# Patient Record
Sex: Female | Born: 1985
Health system: Southern US, Community
[De-identification: ages and names within clinical notes are randomized; demographics above are authoritative.]

## PROBLEM LIST (undated history)

## (undated) ENCOUNTER — Inpatient Hospital Stay (HOSPITAL_COMMUNITY): Payer: Self-pay

## (undated) DIAGNOSIS — D229 Melanocytic nevi, unspecified: Secondary | ICD-10-CM

## (undated) DIAGNOSIS — R51 Headache: Secondary | ICD-10-CM

## (undated) DIAGNOSIS — T85838A Hemorrhage due to other internal prosthetic devices, implants and grafts, initial encounter: Secondary | ICD-10-CM

## (undated) DIAGNOSIS — R5383 Other fatigue: Secondary | ICD-10-CM

## (undated) DIAGNOSIS — D45 Polycythemia vera: Secondary | ICD-10-CM

## (undated) DIAGNOSIS — E282 Polycystic ovarian syndrome: Secondary | ICD-10-CM

## (undated) DIAGNOSIS — C801 Malignant (primary) neoplasm, unspecified: Secondary | ICD-10-CM

## (undated) DIAGNOSIS — T7840XA Allergy, unspecified, initial encounter: Secondary | ICD-10-CM

## (undated) DIAGNOSIS — Z460 Encounter for fitting and adjustment of spectacles and contact lenses: Secondary | ICD-10-CM

## (undated) DIAGNOSIS — J349 Unspecified disorder of nose and nasal sinuses: Secondary | ICD-10-CM

## (undated) DIAGNOSIS — M25529 Pain in unspecified elbow: Secondary | ICD-10-CM

## (undated) DIAGNOSIS — U071 COVID-19: Secondary | ICD-10-CM

## (undated) DIAGNOSIS — G4700 Insomnia, unspecified: Secondary | ICD-10-CM

## (undated) DIAGNOSIS — R112 Nausea with vomiting, unspecified: Secondary | ICD-10-CM

## (undated) DIAGNOSIS — J40 Bronchitis, not specified as acute or chronic: Secondary | ICD-10-CM

## (undated) DIAGNOSIS — Z9889 Other specified postprocedural states: Secondary | ICD-10-CM

## (undated) DIAGNOSIS — R519 Headache, unspecified: Secondary | ICD-10-CM

## (undated) DIAGNOSIS — F988 Other specified behavioral and emotional disorders with onset usually occurring in childhood and adolescence: Secondary | ICD-10-CM

## (undated) DIAGNOSIS — J3489 Other specified disorders of nose and nasal sinuses: Secondary | ICD-10-CM

## (undated) HISTORY — DX: Other fatigue: R53.83

## (undated) HISTORY — DX: Other specified behavioral and emotional disorders with onset usually occurring in childhood and adolescence: F98.8

## (undated) HISTORY — DX: Melanocytic nevi, unspecified: D22.9

## (undated) HISTORY — DX: Malignant (primary) neoplasm, unspecified: C80.1

## (undated) HISTORY — DX: Polycythemia vera: D45

## (undated) HISTORY — DX: Bronchitis, not specified as acute or chronic: J40

## (undated) HISTORY — PX: MUSCLE BIOPSY: SHX716

## (undated) HISTORY — DX: Insomnia, unspecified: G47.00

## (undated) HISTORY — DX: Polycystic ovarian syndrome: E28.2

## (undated) HISTORY — DX: Unspecified disorder of nose and nasal sinuses: J34.9

## (undated) HISTORY — DX: Headache: R51

## (undated) HISTORY — DX: Other specified disorders of nose and nasal sinuses: J34.89

## (undated) HISTORY — DX: Allergy, unspecified, initial encounter: T78.40XA

## (undated) HISTORY — DX: Hemorrhage due to other internal prosthetic devices, implants and grafts, initial encounter: T85.838A

## (undated) HISTORY — DX: Encounter for fitting and adjustment of spectacles and contact lenses: Z46.0

## (undated) HISTORY — PX: OTHER SURGICAL HISTORY: SHX169

## (undated) HISTORY — DX: Pain in unspecified elbow: M25.529

## (undated) HISTORY — DX: Headache, unspecified: R51.9

---
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
## 2005-01-03 ENCOUNTER — Encounter: Admission: RE | Admit: 2005-01-03 | Discharge: 2005-01-03 | Payer: Self-pay | Admitting: Family Medicine

## 2005-09-06 ENCOUNTER — Other Ambulatory Visit: Admission: RE | Admit: 2005-09-06 | Discharge: 2005-09-06 | Payer: Self-pay | Admitting: Family Medicine

## 2007-05-02 ENCOUNTER — Encounter: Admission: RE | Admit: 2007-05-02 | Discharge: 2007-05-02 | Payer: Self-pay | Admitting: Family Medicine

## 2008-07-11 ENCOUNTER — Encounter: Admission: RE | Admit: 2008-07-11 | Discharge: 2008-07-11 | Payer: Self-pay | Admitting: Obstetrics and Gynecology

## 2010-09-15 ENCOUNTER — Encounter: Admission: RE | Admit: 2010-09-15 | Discharge: 2010-09-15 | Payer: Self-pay | Admitting: Family Medicine

## 2011-01-16 ENCOUNTER — Encounter (HOSPITAL_COMMUNITY): Payer: Self-pay | Admitting: Obstetrics and Gynecology

## 2011-03-01 DIAGNOSIS — R519 Headache, unspecified: Secondary | ICD-10-CM

## 2011-03-01 DIAGNOSIS — G4700 Insomnia, unspecified: Secondary | ICD-10-CM

## 2011-03-01 DIAGNOSIS — D229 Melanocytic nevi, unspecified: Secondary | ICD-10-CM

## 2011-03-01 DIAGNOSIS — R5383 Other fatigue: Secondary | ICD-10-CM

## 2011-03-01 DIAGNOSIS — Z460 Encounter for fitting and adjustment of spectacles and contact lenses: Secondary | ICD-10-CM

## 2011-03-01 DIAGNOSIS — J3489 Other specified disorders of nose and nasal sinuses: Secondary | ICD-10-CM

## 2011-03-01 DIAGNOSIS — F988 Other specified behavioral and emotional disorders with onset usually occurring in childhood and adolescence: Secondary | ICD-10-CM

## 2011-03-01 HISTORY — DX: Other specified behavioral and emotional disorders with onset usually occurring in childhood and adolescence: F98.8

## 2011-03-01 HISTORY — DX: Insomnia, unspecified: G47.00

## 2011-03-01 HISTORY — DX: Other fatigue: R53.83

## 2011-03-01 HISTORY — DX: Headache, unspecified: R51.9

## 2011-03-01 HISTORY — DX: Melanocytic nevi, unspecified: D22.9

## 2011-03-01 HISTORY — DX: Encounter for fitting and adjustment of spectacles and contact lenses: Z46.0

## 2011-03-01 HISTORY — DX: Other specified disorders of nose and nasal sinuses: J34.89

## 2011-03-23 ENCOUNTER — Other Ambulatory Visit: Payer: Self-pay | Admitting: General Surgery

## 2011-03-23 DIAGNOSIS — IMO0002 Reserved for concepts with insufficient information to code with codable children: Secondary | ICD-10-CM

## 2011-03-29 ENCOUNTER — Other Ambulatory Visit: Payer: Self-pay

## 2011-04-04 ENCOUNTER — Other Ambulatory Visit: Payer: Self-pay

## 2011-05-16 ENCOUNTER — Other Ambulatory Visit: Payer: Self-pay

## 2011-05-30 ENCOUNTER — Ambulatory Visit
Admission: RE | Admit: 2011-05-30 | Discharge: 2011-05-30 | Disposition: A | Discharge status: Home / Self Care | Payer: BC Managed Care – PPO | Source: Ambulatory Visit | Attending: General Surgery | Admitting: General Surgery

## 2011-05-30 DIAGNOSIS — IMO0002 Reserved for concepts with insufficient information to code with codable children: Secondary | ICD-10-CM

## 2011-05-30 MED ORDER — IOHEXOL 300 MG/ML  SOLN
100.0000 mL | Freq: Once | INTRAMUSCULAR | Status: AC | PRN
  Administered 2011-05-30: 100 mL via INTRAVENOUS

## 2011-06-16 ENCOUNTER — Encounter (INDEPENDENT_AMBULATORY_CARE_PROVIDER_SITE_OTHER): Payer: Self-pay | Admitting: General Surgery

## 2011-07-15 ENCOUNTER — Encounter (INDEPENDENT_AMBULATORY_CARE_PROVIDER_SITE_OTHER): Payer: Self-pay | Admitting: General Surgery

## 2011-07-18 ENCOUNTER — Ambulatory Visit (INDEPENDENT_AMBULATORY_CARE_PROVIDER_SITE_OTHER): Payer: BC Managed Care – PPO | Admitting: General Surgery

## 2011-08-01 ENCOUNTER — Encounter (INDEPENDENT_AMBULATORY_CARE_PROVIDER_SITE_OTHER): Payer: Self-pay | Admitting: General Surgery

## 2011-08-01 ENCOUNTER — Ambulatory Visit (INDEPENDENT_AMBULATORY_CARE_PROVIDER_SITE_OTHER): Payer: BC Managed Care – PPO | Admitting: General Surgery

## 2011-08-01 VITALS — BP 118/66 | HR 90 | Temp 96.7°F | Ht 66.0 in | Wt 167.2 lb

## 2011-08-01 DIAGNOSIS — R1904 Left lower quadrant abdominal swelling, mass and lump: Secondary | ICD-10-CM

## 2011-08-01 NOTE — Progress Notes (Signed)
Wanda Allen is a 25 y.o. female.    Chief Complaint  Patient presents with  . Other    llq pain discuss CT    HPI HPI Pt has a 6 month history of LLQ mass/swelling that she found when exercising.  She was doing very intense cardio workout when she noted a swelling and throbbing pain.  She saw Dr. Freida Busman in march and was advised that this may be a hernia vs a abdominal wall mass.  She underwent a CT scan in June which was normal.  As part of this evaluation, she reduced her activity level in march and the pain improved.  When she saw Dr. Freida Busman in June, she returned to normal activity.  The pain has started to increase again, and she has noted the mass is more prominent.  It usually is around a 4-5 out of 10 pain scale at its worst, but she has had an episode of 8/10 pain.  It wakes her up at night when she rolls over on that side.  She notes that it is worse during her menstrual cycle when she is bloated.  She has some chronic mild nausea that she associates with reflux, but this is unchanged.    Past Medical History  Diagnosis Date  . Allergy   . Asthma   . Headache disorder 03/01/2011  . Skin moles 03/01/2011  . Fatigue 03/01/2011    Loss of sleep/fatigue  . Insomnia, unspecified 03/01/2011  . Sinus drainage 03/01/2011  . Contact lens/glasses fitting 03/01/2011  . ADD (attention deficit disorder) 03/01/2011  . Joint pain, elbow   . Attention deficit disorder (ADD)   . Fatigue   . Atypical moles   . Joint pain, elbow   . Bronchitis   . Abdominal pain   . Sinus problem   . Generalized headaches     May be due to allergies or medication. Patient is not sure.    Past Surgical History  Procedure Date  . Mole     Family History  Problem Relation Age of Onset  . Breast cancer    . Other Mother     abnormal pap smears  . Other Brother     tachycardia    Social History History  Substance Use Topics  . Smoking status: Never Smoker   . Smokeless tobacco: Not on file  . Alcohol  Use: 0.0 oz/week     Social Drinker    Allergies  Allergen Reactions  . Latex Hives and Rash    Were touched.  Marland Kitchen Sulphur (Sulfur Sublimed) Rash    All over body.    Current Outpatient Prescriptions  Medication Sig Dispense Refill  . albuterol (PROVENTIL) (2.5 MG/3ML) 0.083% nebulizer solution Take 2.5 mg by nebulization as needed.        Marland Kitchen lisdexamfetamine (VYVANSE) 60 MG capsule Take 60 mg by mouth every morning.        . norethindrone-ethinyl estradiol (MICROGESTIN,JUNEL,LOESTRIN) 1-20 MG-MCG tablet Take 1 tablet by mouth daily.        . calcium carbonate (TUMS - DOSED IN MG ELEMENTAL CALCIUM) 500 MG chewable tablet Chew 1 tablet by mouth as needed.        . etonogestrel-ethinyl estradiol (NUVARING) 0.12-0.015 MG/24HR vaginal ring Place 1 each vaginally every 28 (twenty-eight) days. Insert vaginally and leave in place for 3 consecutive weeks, then remove for 1 week.       . fluticasone (FLONASE) 50 MCG/ACT nasal spray Place 2 sprays into the nose  as needed.        . pseudoephedrine (SUDAFED) 30 MG tablet Take 30 mg by mouth as needed.          Review of Systems Review of Systems  Constitutional: Negative.   HENT: Positive for congestion.   Eyes: Negative.   Respiratory: Positive for shortness of breath.   Cardiovascular: Negative.   Gastrointestinal: Positive for nausea and abdominal pain.  Genitourinary: Negative.   Musculoskeletal: Negative.   Skin: Negative.   Neurological: Positive for headaches.  Psychiatric/Behavioral: The patient is nervous/anxious.     Physical Exam Physical Exam  Constitutional: She is oriented to person, place, and time. She appears well-developed and well-nourished. No distress.  HENT:  Head: Normocephalic and atraumatic.  Eyes: Conjunctivae are normal. Pupils are equal, round, and reactive to light.  Neck: Normal range of motion. Neck supple. No thyromegaly present.  Cardiovascular: Normal rate, regular rhythm and normal heart sounds.  Exam  reveals no gallop and no friction rub.   No murmur heard. Respiratory: Effort normal and breath sounds normal. No respiratory distress. She has no wheezes. She has no rales. She exhibits no tenderness.  GI: Bowel sounds are normal. She exhibits mass (2 cm area of mass palpable when standing.  ). She exhibits no distension. There is tenderness (LLQ). There is no rebound and no guarding.  Musculoskeletal: Normal range of motion. She exhibits no edema and no tenderness.  Lymphadenopathy:    She has no cervical adenopathy.  Neurological: She is alert and oriented to person, place, and time. Coordination normal.  Skin: Skin is warm and dry. No rash noted. She is not diaphoretic. No erythema. No pallor.  Psychiatric: She has a normal mood and affect. Her behavior is normal. Judgment and thought content normal.     Blood pressure 118/66, pulse 90, temperature 96.7 F (35.9 C), temperature source Temporal, height 5\' 6"  (1.676 m), weight 167 lb 3.2 oz (75.841 kg).  Assessment/Plan  Abdominal mass, left lower quadrant, possible hernia Spigelian hernia vs lipoma Symptomatic with activity. Diagnostic laparoscopy and hernia repair vs resection of abdominal wall mass. Discussed risks, benefits including infection, bleeding, damage to adjacent structures.        Margarethe Virgen 08/01/2011, 10:59 AM

## 2011-08-01 NOTE — Assessment & Plan Note (Addendum)
Spigelian hernia vs lipoma Symptomatic with activity. Diagnostic laparoscopy and hernia repair vs resection of abdominal wall mass. Discussed risks, benefits including infection, bleeding, damage to adjacent structures.

## 2011-08-10 ENCOUNTER — Ambulatory Visit (INDEPENDENT_AMBULATORY_CARE_PROVIDER_SITE_OTHER): Payer: BC Managed Care – PPO | Admitting: General Surgery

## 2011-09-26 ENCOUNTER — Encounter (HOSPITAL_COMMUNITY)
Admission: RE | Admit: 2011-09-26 | Discharge: 2011-09-26 | Disposition: A | Discharge status: Home / Self Care | Payer: BC Managed Care – PPO | Source: Ambulatory Visit | Attending: General Surgery | Admitting: General Surgery

## 2011-09-26 ENCOUNTER — Telehealth (INDEPENDENT_AMBULATORY_CARE_PROVIDER_SITE_OTHER): Payer: Self-pay | Admitting: General Surgery

## 2011-09-26 ENCOUNTER — Other Ambulatory Visit (INDEPENDENT_AMBULATORY_CARE_PROVIDER_SITE_OTHER): Payer: Self-pay | Admitting: General Surgery

## 2011-09-26 DIAGNOSIS — R1900 Intra-abdominal and pelvic swelling, mass and lump, unspecified site: Secondary | ICD-10-CM

## 2011-09-26 LAB — BASIC METABOLIC PANEL
BUN: 13 mg/dL (ref 6–23)
CO2: 28 mEq/L (ref 19–32)
Chloride: 101 mEq/L (ref 96–112)
Glucose, Bld: 91 mg/dL (ref 70–99)
Potassium: 4.8 mEq/L (ref 3.5–5.1)
Sodium: 136 mEq/L (ref 135–145)

## 2011-09-26 LAB — HCG, SERUM, QUALITATIVE: Preg, Serum: NEGATIVE

## 2011-09-26 LAB — SURGICAL PCR SCREEN: Staphylococcus aureus: NEGATIVE

## 2011-09-26 LAB — CBC
MCV: 91.3 fL (ref 78.0–100.0)
Platelets: 247 10*3/uL (ref 150–400)
RBC: 4.93 MIL/uL (ref 3.87–5.11)
WBC: 6.1 10*3/uL (ref 4.0–10.5)

## 2011-09-26 NOTE — Progress Notes (Signed)
Quick Note:  Labs ok for surgery ______ 

## 2011-09-26 NOTE — Telephone Encounter (Signed)
The patient called requesting medication for anxiety to take the night prior to her surgery on 09/30/11 ta

## 2011-09-30 ENCOUNTER — Other Ambulatory Visit (INDEPENDENT_AMBULATORY_CARE_PROVIDER_SITE_OTHER): Payer: Self-pay | Admitting: General Surgery

## 2011-09-30 ENCOUNTER — Ambulatory Visit (HOSPITAL_COMMUNITY)
Admission: RE | Admit: 2011-09-30 | Discharge: 2011-09-30 | Disposition: A | Discharge status: Home / Self Care | Payer: BC Managed Care – PPO | Source: Ambulatory Visit | Attending: General Surgery | Admitting: General Surgery

## 2011-09-30 DIAGNOSIS — Z01810 Encounter for preprocedural cardiovascular examination: Secondary | ICD-10-CM | POA: Insufficient documentation

## 2011-09-30 DIAGNOSIS — R109 Unspecified abdominal pain: Secondary | ICD-10-CM

## 2011-09-30 DIAGNOSIS — D1739 Benign lipomatous neoplasm of skin and subcutaneous tissue of other sites: Secondary | ICD-10-CM

## 2011-09-30 DIAGNOSIS — Z01818 Encounter for other preprocedural examination: Secondary | ICD-10-CM | POA: Insufficient documentation

## 2011-09-30 DIAGNOSIS — Z01812 Encounter for preprocedural laboratory examination: Secondary | ICD-10-CM | POA: Insufficient documentation

## 2011-10-07 ENCOUNTER — Other Ambulatory Visit (INDEPENDENT_AMBULATORY_CARE_PROVIDER_SITE_OTHER): Payer: Self-pay | Admitting: General Surgery

## 2011-10-07 ENCOUNTER — Encounter (INDEPENDENT_AMBULATORY_CARE_PROVIDER_SITE_OTHER): Payer: Self-pay | Admitting: General Surgery

## 2011-10-07 ENCOUNTER — Ambulatory Visit (INDEPENDENT_AMBULATORY_CARE_PROVIDER_SITE_OTHER): Payer: BC Managed Care – PPO | Admitting: General Surgery

## 2011-10-07 ENCOUNTER — Encounter (INDEPENDENT_AMBULATORY_CARE_PROVIDER_SITE_OTHER): Payer: Self-pay

## 2011-10-07 VITALS — BP 122/78 | HR 80 | Temp 99.3°F | Resp 20 | Ht 66.0 in | Wt 169.0 lb

## 2011-10-07 DIAGNOSIS — R1904 Left lower quadrant abdominal swelling, mass and lump: Secondary | ICD-10-CM

## 2011-10-07 MED ORDER — IBUPROFEN 600 MG PO TABS: 600.0000 mg | ORAL_TABLET | Freq: Three times a day (TID) | ORAL | Status: AC | PRN

## 2011-10-07 NOTE — Assessment & Plan Note (Addendum)
No hernia found on dx laparoscopy. Doing well. Drain output decreased. Drain discontinued Ibuprofen/tylenol for pain. Follow up PRN

## 2011-10-07 NOTE — Progress Notes (Signed)
HISTORY: Patient is doing well status post excision of abdominal wall mass and diagnostic laparoscopy because of left lower quadrant pain. This was negative for hernia or other gross pathology. She fevers or chills. She has been keeping track of her drain output has come down to around 20-25 mL per day. She denies significant discomfort. She developed a rash with oxycodone. She has been taking Tylenol for discomfort. She is still a reasonably sore and having difficulty with steps.   PERTINENT REVIEW OF SYSTEMS: Otherwise negative.   EXAM: Head: Normocephalic and atraumatic.  Eyes:  Conjunctivae are normal. Pupils are equal, round, and reactive to light. No scleral icterus.  Neck:  Normal range of motion. Neck supple. No tracheal deviation present. No thyromegaly present.  Resp: No respiratory distress, normal effort. Abd:  Abdomen is soft, non distended and non tender. No masses are palpable.  There is no rebound and no guarding.  Neurological: Alert and oriented to person, place, and time. Coordination normal.  Skin: Skin is warm and dry. No rash noted. No diaphoretic. No erythema. No pallor.  Psychiatric: Normal mood and affect. Normal behavior. Judgment and thought content normal.     Pathology reviewed and demonstrates: lipoma  ASSESSMENT AND PLAN:   Abdominal mass, left lower quadrant, possible hernia No hernia found on dx laparoscopy. Doing well. Drain output decreased. Drain discontinued Ibuprofen/tylenol for pain. Follow up PRN  Back to work in another 1.5 weeks.       Maudry Diego, MD Surgical Oncology, General & Endocrine Surgery [facility]  Astrid Divine, MD, MD Astrid Divine*

## 2011-10-11 NOTE — Op Note (Signed)
NAMEELYSSA, PENDELTON NO.:  [id number]  MEDICAL RECORD NO.:  [id number]  LOCATION:  [facility]                         FACILITY:  [facility]  PHYSICIAN:  Almond Lint, MD       DATE OF BIRTH:  [birth year]  DATE OF PROCEDURE:  09/30/2011 DATE OF DISCHARGE:                              OPERATIVE REPORT   PREOPERATIVE DIAGNOSIS:  Left abdominal wall mass.  POSTOPERATIVE DIAGNOSIS:  Left abdominal wall mass.  PROCEDURE:  Diagnostic laparoscopy, left groin exploration and excision of left abdominal wall mass, subcutaneous 5 x 5 cm.  SURGEON:  Almond Lint, MD  ASSISTANT:  None.  ANESTHESIA:  General and local.  FINDINGS:  Probable lipoma.  SPECIMEN:  Left abdominal wall mass to pathology.  ESTIMATED BLOOD LOSS:  Minimal.  COMPLICATIONS:  None known.  PROCEDURE:  Ms. Ventola was identified in the holding area and taken to the operating room where she was placed supine on the operating room table.  General anesthesia was induced.  A Foley catheter was placed and her arms were tucked.  The patient was then placed into reverse Trendelenburg position and rotated to the right.  A OptiView 5-mm trocar was placed in the left upper quadrant at the costal margin. Pneumoperitoneum was achieved to a pressure of 15 mmHg.  The abdomen was examined.  There was no evidence of gross pathology.  The abdominal wall was carefully examined and there was no evidence of any hernia at this location.  The area of pain and swelling was pressed and there was no evidence of fascial defect.  There was also no evidence of the inguinal hernia.  The other side was examined as well and these were both symmetric.  The entire abdominal wall was examined.  There was no evidence of spigelian hernia or umbilical hernia.  The intra-abdominal contents were examined superficially.  There was no evidence of gross pathology.  The pneumoperitoneum was allowed to release.  The trocar was removed.    The  inguinal-type hernia incision was then made in the left inguinal region and obliquely oriented transverse position.  The Scarpa fascia was opened.  The external oblique was identified and was cleaned off with a Kittner.  The inguinal floor was quite tight.  There was no evidence of an inguinal hernia in indirect or the direct location. There was no evidence of any additional hernia in the abdominal wall. The fatty tissue in the area did appear to be a mass-like area and this was taken out from underneath the skin.  Because of the size of the defect, a drain was placed laterally.  This was a 15 Blake drain.  A drain was sutured in place with a 2-0 nylon.  The hemostasis was achieved with the cautery.  The wound was then reapproximated with 3-0 Vicryl deep dermal sutures and 4-0 Monocryl running subcuticular sutures.  The wound was then irrigated, cleaned, dried and dressed with Dermabond.  The left upper quadrant trocar incision was also closed with a 4-0 Monocryl in subcutaneous fashion.  This was also dressed with Dermabond.  The patient was weaned from anesthesia and taken to PACU in stable condition.  Needle, sponge and instrument counts were  correct.     Almond Lint, MD     FB/MEDQ  D:  09/30/2011  T:  09/30/2011  Job:  [id number]  Electronically Signed by Almond Lint MD on 10/11/2011 07:43:23 PM

## 2012-05-15 ENCOUNTER — Other Ambulatory Visit: Payer: Self-pay | Admitting: Obstetrics and Gynecology

## 2013-09-11 ENCOUNTER — Other Ambulatory Visit: Payer: Self-pay | Admitting: Obstetrics and Gynecology

## 2014-05-05 ENCOUNTER — Other Ambulatory Visit (HOSPITAL_COMMUNITY)
Admission: RE | Admit: 2014-05-05 | Discharge: 2014-05-05 | Disposition: A | Discharge status: Home / Self Care | Payer: BC Managed Care – PPO | Source: Ambulatory Visit | Attending: Family Medicine | Admitting: Family Medicine

## 2014-05-05 ENCOUNTER — Other Ambulatory Visit: Payer: Self-pay | Admitting: Family Medicine

## 2014-05-05 DIAGNOSIS — Z124 Encounter for screening for malignant neoplasm of cervix: Secondary | ICD-10-CM | POA: Insufficient documentation

## 2014-05-05 DIAGNOSIS — R87810 Cervical high risk human papillomavirus (HPV) DNA test positive: Secondary | ICD-10-CM | POA: Insufficient documentation

## 2014-05-05 DIAGNOSIS — Z1151 Encounter for screening for human papillomavirus (HPV): Secondary | ICD-10-CM | POA: Insufficient documentation

## 2014-05-05 DIAGNOSIS — Z113 Encounter for screening for infections with a predominantly sexual mode of transmission: Secondary | ICD-10-CM | POA: Insufficient documentation

## 2014-07-10 ENCOUNTER — Ambulatory Visit: Payer: BC Managed Care – PPO | Attending: Gynecologic Oncology | Admitting: Gynecologic Oncology

## 2014-07-10 ENCOUNTER — Encounter: Payer: Self-pay | Admitting: Gynecologic Oncology

## 2014-07-10 VITALS — BP 138/75 | HR 99 | Temp 98.5°F | Resp 18 | Ht 66.0 in | Wt 156.7 lb

## 2014-07-10 DIAGNOSIS — R87619 Unspecified abnormal cytological findings in specimens from cervix uteri: Secondary | ICD-10-CM | POA: Insufficient documentation

## 2014-07-10 DIAGNOSIS — N879 Dysplasia of cervix uteri, unspecified: Secondary | ICD-10-CM | POA: Insufficient documentation

## 2014-07-10 DIAGNOSIS — F988 Other specified behavioral and emotional disorders with onset usually occurring in childhood and adolescence: Secondary | ICD-10-CM | POA: Diagnosis not present

## 2014-07-10 DIAGNOSIS — G4700 Insomnia, unspecified: Secondary | ICD-10-CM | POA: Insufficient documentation

## 2014-07-10 DIAGNOSIS — J45909 Unspecified asthma, uncomplicated: Secondary | ICD-10-CM | POA: Diagnosis not present

## 2014-07-10 DIAGNOSIS — R897 Abnormal histological findings in specimens from other organs, systems and tissues: Secondary | ICD-10-CM

## 2014-07-10 DIAGNOSIS — R87618 Other abnormal cytological findings on specimens from cervix uteri: Secondary | ICD-10-CM

## 2014-07-10 DIAGNOSIS — Z79899 Other long term (current) drug therapy: Secondary | ICD-10-CM | POA: Diagnosis not present

## 2014-07-10 NOTE — Progress Notes (Signed)
Consult Note: Gyn-Onc  Wanda Allen 28 y.o. female  CC:  Chief Complaint  Patient presents with  . Abnormal Pap smears    HPI: Patient is seen today in consultation as a second opinion. She is referred by Dr. Kelton Pillar at the patient's request.  Patient is a 28 year old gravida 0 who was seen by Dr. Lemar Livings several times her cervical dysplasia. She went colposcopy and directed biopsies in May 2013 for Pap smear revealed atypical squamous cells of undetermined significance. The biopsies were negative. 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 The patient of note has completed the garden cell vaccine series approximately 3 years ago. She states that Dr. Julien Girt had been encouraging her to followup and the patient did not followup for any additional evaluation or procedures. The patient states she was fearful and did not wish to have any other procedures done at that time. She was fearful of the impact of procedures on her fertility.  She recently had a Pap smear performed by Dr. 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  She's currently not sexually active. She recently ended 2 year relationship. Her cycles are fairly regular. Her last cycle was June 16 and she believes she is about to start her cycle now. She stopped her birth control pills secondary to trying a "regulate her hormones" as she has several autoimmune issues occurring. She has had issues with constipation improved with diet. She denies use of tobacco or alcohol. She works as a Haematologist. She exercises about 4 times per week. She has been classes, yoga, and lives weight.   Current Meds:  Outpatient Encounter Prescriptions as of 07/10/2014  Medication Sig  . amphetamine-dextroamphetamine (ADDERALL) 30 MG tablet   .  NON FORMULARY Supplement: Seven Treasures Formula 2 capsules  . albuterol (PROVENTIL) (2.5 MG/3ML) 0.083% nebulizer solution Take 2.5 mg by nebulization as needed.    . calcium carbonate (TUMS - DOSED IN MG ELEMENTAL CALCIUM) 500 MG chewable tablet Chew 1 tablet by mouth as needed.    . fluticasone (FLONASE) 50 MCG/ACT nasal spray Place 2 sprays into the nose as needed.    . pseudoephedrine (SUDAFED) 30 MG tablet Take 30 mg by mouth as needed.    . [DISCONTINUED] etonogestrel-ethinyl estradiol (NUVARING) 0.12-0.015 MG/24HR vaginal ring Place 1 each vaginally every 28 (twenty-eight) days. Insert vaginally and leave in place for 3 consecutive weeks, then remove for 1 week.   . [DISCONTINUED] lisdexamfetamine (VYVANSE) 60 MG capsule Take 60 mg by mouth every morning.    . [DISCONTINUED] norethindrone-ethinyl estradiol (MICROGESTIN,JUNEL,LOESTRIN) 1-20 MG-MCG tablet Take 1 tablet by mouth daily.    . [DISCONTINUED] predniSONE (DELTASONE) 10 MG tablet   . [DISCONTINUED] traMADol (ULTRAM) 50 MG tablet     Allergy:  Allergies  Allergen Reactions  . Effexor Xr  [Venlafaxine Hcl Er]   . Latex Hives and Rash    Were touched.  Melida Gimenez [Sulfur Sublimed] Rash    All over body.    Social Hx:   History   Social History  . Marital Status: Single    Spouse Name: N/A    Number of Children: 0  . Years of Education: N/A   Occupational History  .     Social History Main Topics  . Smoking status: Never Smoker   . Smokeless  tobacco: Not on file  . Alcohol Use: 0.0 oz/week     Comment: Social Drinker  . Drug Use: No  . Sexual Activity: Not on file   Other Topics Concern  . Not on file   Social History Narrative  . No narrative on file    Past Surgical Hx:  Past Surgical History  Procedure Laterality Date  . Mole    . Muscle biopsy    . Mass removal      Past Medical Hx:  Past Medical History  Diagnosis Date  . Allergy   . Asthma   . Headache disorder 03/01/2011  . Skin moles  03/01/2011  . Fatigue 03/01/2011    Loss of sleep/fatigue  . Insomnia, unspecified 03/01/2011  . Sinus drainage 03/01/2011  . Contact lens/glasses fitting 03/01/2011  . ADD (attention deficit disorder) 03/01/2011  . Joint pain, elbow   . Attention deficit disorder (ADD)   . Fatigue   . Atypical moles   . Joint pain, elbow   . Bronchitis   . Abdominal pain   . Sinus problem   . Generalized headaches     May be due to allergies or medication. Patient is not sure.  . JP drain bleeding     Oncology Hx:   No history exists.    Family Hx:  Family History  Problem Relation Age of Onset  . Breast cancer    . Other Mother     abnormal pap smears  . Other Brother     tachycardia    Vitals:  Blood pressure 138/75, pulse 99, temperature 98.5 F (36.9 C), temperature source Oral, resp. rate 18, height 5' 6"  (1.676 m), weight 156 lb 11.2 oz (71.079 kg).  Physical Exam: Well-nourished well-developed female in no acute distress.  Pelvic: Normal external female genitalia. The vagina is well epithelialized no visible lesions. The cervix is visualized is nulliparous. There's a physiologic discharge. There's a menstrual flow. There are no gross visible lesions. Colposcopic evaluation was performed after the application of the gastric. Colposcopy was adequate. There were no acetowhite epithelial changes. After obtaining the patient's verbal consent, and endocervical curettage was performed. The patient tolerated it well. Bimanual examination reveals the uterus to be of normal size, shape, and consistency. There are no adnexal masses.  Assessment/Plan: 28 year old gravida 0 with a history of cervical biopsy on the ectocervix revealing low-grade dysplasia. 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 I met with her and her mother and explained to her that a Pap smear is a screening test in that the biopsies much more diagnostic. We will followup on the results of her  endocervical curettage. If it's unremarkable I am comfortable following expectantly with a repeat pap smear. If the endocervical curettage is positive, she'll require an excisional procedure. The implications regarding pregnancy were discussed with her mother as well as the patient. Her questions were elicited and answered to her satisfaction.  Mckenzey Parcell A., MD 07/10/2014, 1:58 PM

## 2014-07-10 NOTE — Patient Instructions (Signed)

## 2014-07-31 ENCOUNTER — Telehealth: Payer: Self-pay | Admitting: Gynecologic Oncology

## 2014-07-31 ENCOUNTER — Telehealth: Payer: Self-pay | Admitting: *Deleted

## 2014-07-31 NOTE — Telephone Encounter (Signed)
LM for her to call the office to schedule a follow up appt. PG

## 2014-07-31 NOTE — Telephone Encounter (Signed)
PER MD scheduled pt for F/U pap and Colpo. lmovm for pt with Appt 9/16 at 0945am./ Request pt call back to confirm appt date/time

## 2014-09-10 ENCOUNTER — Ambulatory Visit: Payer: BC Managed Care – PPO | Attending: Gynecologic Oncology | Admitting: Gynecologic Oncology

## 2014-09-10 ENCOUNTER — Other Ambulatory Visit (HOSPITAL_COMMUNITY)
Admission: RE | Admit: 2014-09-10 | Discharge: 2014-09-10 | Disposition: A | Discharge status: Home / Self Care | Payer: BC Managed Care – PPO | Source: Ambulatory Visit | Attending: Gynecologic Oncology | Admitting: Gynecologic Oncology

## 2014-09-10 ENCOUNTER — Encounter: Payer: Self-pay | Admitting: Gynecologic Oncology

## 2014-09-10 VITALS — BP 122/68 | HR 77 | Temp 98.0°F | Resp 20 | Ht 66.0 in | Wt 155.5 lb

## 2014-09-10 DIAGNOSIS — L738 Other specified follicular disorders: Secondary | ICD-10-CM | POA: Diagnosis not present

## 2014-09-10 DIAGNOSIS — L678 Other hair color and hair shaft abnormalities: Secondary | ICD-10-CM | POA: Diagnosis not present

## 2014-09-10 DIAGNOSIS — Z124 Encounter for screening for malignant neoplasm of cervix: Secondary | ICD-10-CM | POA: Insufficient documentation

## 2014-09-10 DIAGNOSIS — Z1151 Encounter for screening for human papillomavirus (HPV): Secondary | ICD-10-CM | POA: Insufficient documentation

## 2014-09-10 DIAGNOSIS — R87810 Cervical high risk human papillomavirus (HPV) DNA test positive: Secondary | ICD-10-CM | POA: Insufficient documentation

## 2014-09-10 DIAGNOSIS — R87613 High grade squamous intraepithelial lesion on cytologic smear of cervix (HGSIL): Secondary | ICD-10-CM

## 2014-09-10 DIAGNOSIS — Z8541 Personal history of malignant neoplasm of cervix uteri: Secondary | ICD-10-CM | POA: Diagnosis not present

## 2014-09-10 NOTE — Patient Instructions (Signed)
- 

## 2014-09-10 NOTE — Progress Notes (Signed)
Consult Note: Gyn-Onc  Wanda Allen 28 y.o. female  CC:  Chief Complaint  Patient presents with  . Follow-up    HPI:  Patient is a 28 year old gravida 0 who was seen by Dr. Lemar Livings several times her cervical dysplasia. She went colposcopy and directed biopsies in May 2013 for Pap smear revealed atypical squamous cells of undetermined significance. The biopsies were negative. 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 The patient of note has completed the garden cell vaccine series approximately 3 years ago. She states that Dr. Julien Girt had been encouraging her to followup and the patient did not followup for any additional evaluation or procedures. The patient states she was fearful and did not wish to have any other procedures done at that time. She was fearful of the impact of procedures on her fertility.  She had a Pap smear performed by Dr. 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 I saw her in July. At that time her colposcopy was negative. An endocervical curettage was performed that was negative. She comes in today for followup Pap smear.  She's overall doing fairly well. Her last cycle cannot 2 weeks early and was slightly different in that it lasted longer and was heavier. She did wear a pad as well as tampons. She's also been having issues with increasing left-sided pain on her left side. She has been seen by an orthopedist in the past and they recommended steroids which is not wanted to do. She continues to be physically active riding a spin bike and doing yoga. She did wax recently in the pubic area and has some irritation from that. This in conjunction with needing to wear a pad as well as her tampons this caused her to have a lesion on her right side. She has been off her birth  control pills for past 5-6 months. This is her first cycle was been slightly irregular.   Current Meds:  Outpatient Encounter Prescriptions as of 09/10/2014  Medication Sig  . amphetamine-dextroamphetamine (ADDERALL) 30 MG tablet   . NON FORMULARY Supplement: Seven Treasures Formula 2 capsules  . albuterol (PROVENTIL) (2.5 MG/3ML) 0.083% nebulizer solution Take 2.5 mg by nebulization as needed.    . calcium carbonate (TUMS - DOSED IN MG ELEMENTAL CALCIUM) 500 MG chewable tablet Chew 1 tablet by mouth as needed.    . fluticasone (FLONASE) 50 MCG/ACT nasal spray Place 2 sprays into the nose as needed.    . pseudoephedrine (SUDAFED) 30 MG tablet Take 30 mg by mouth as needed.      Allergy:  Allergies  Allergen Reactions  . Effexor Xr  [Venlafaxine Hcl Er]   . Latex Hives and Rash    Were touched.  . Sulfa Antibiotics Rash  . Sulphur [Sulfur Sublimed] Rash    All over body.    Social Hx:   History   Social History  . Marital Status: Single    Spouse Name: N/A    Number of Children: 0  . Years of Education: N/A   Occupational History  .     Social History Main Topics  . Smoking status: Never Smoker   . Smokeless tobacco: Not on file  . Alcohol Use: 0.0 oz/week     Comment: Social Drinker  . Drug Use: No  . Sexual Activity: Not  on file   Other Topics Concern  . Not on file   Social History Narrative  . No narrative on file    Past Surgical Hx:  Past Surgical History  Procedure Laterality Date  . Mole    . Muscle biopsy    . Mass removal      Past Medical Hx:  Past Medical History  Diagnosis Date  . Allergy   . Asthma   . Headache disorder 03/01/2011  . Skin moles 03/01/2011  . Fatigue 03/01/2011    Loss of sleep/fatigue  . Insomnia, unspecified 03/01/2011  . Sinus drainage 03/01/2011  . Contact lens/glasses fitting 03/01/2011  . ADD (attention deficit disorder) 03/01/2011  . Joint pain, elbow   . Attention deficit disorder (ADD)   . Fatigue   . Atypical moles    . Joint pain, elbow   . Bronchitis   . Abdominal pain   . Sinus problem   . Generalized headaches     May be due to allergies or medication. Patient is not sure.  . JP drain bleeding     Oncology Hx:   No history exists.    Family Hx:  Family History  Problem Relation Age of Onset  . Breast cancer    . Other Mother     abnormal pap smears  . Other Brother     tachycardia    Vitals:  Blood pressure 122/68, pulse 77, temperature 98 F (36.7 C), temperature source Oral, resp. rate 20, height 5\' 6"  (1.676 m), weight 155 lb 8 oz (70.534 kg).  Physical Exam: Well-nourished well-developed female in no acute distress.  Pelvic: Normal external female genitalia notable for some folliculitis. The vagina is well epithelialized no visible lesions. The cervix is visualized is nulliparous. There's a physiologic discharge. There's no visible lesions. Pap smear was submitted without difficulty. Bimanual examination the cervix is palpably normal. The uterus is of normal size shape and consistency. There are no adnexal masses.  Assessment/Plan: 28 year old gravida 0 with a history of cervical biopsy on the ectocervix revealing low-grade dysplasia. 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 Colposcopy with ECC was negative in July of this year. She comes in today for followup Pap smear. I will notify her of the results of her Pap smear. We'll determine her disposition pending these results.  Nitika Jackowski A., MD 09/10/2014, 10:15 AM

## 2014-09-15 LAB — CYTOLOGY - PAP

## 2014-09-19 ENCOUNTER — Telehealth: Payer: Self-pay | Admitting: Gynecologic Oncology

## 2014-09-19 NOTE — Telephone Encounter (Signed)
Left message asking patient to call back the office to discuss Dr. Elenora Gamma recommendations.

## 2014-09-22 ENCOUNTER — Telehealth: Payer: Self-pay | Admitting: Gynecologic Oncology

## 2014-09-22 NOTE — Telephone Encounter (Signed)
Patient notified of pap smear results and Dr. Elenora Gamma recommendations for repeat pap and colpo in 4 months.  She is to call in one to two months to schedule her appt.  Advised to call for any questions or concerns.

## 2014-10-10 ENCOUNTER — Other Ambulatory Visit: Payer: Self-pay

## 2015-01-15 ENCOUNTER — Ambulatory Visit: Payer: BC Managed Care – PPO | Admitting: Gynecologic Oncology

## 2015-01-15 ENCOUNTER — Ambulatory Visit: Payer: BLUE CROSS/BLUE SHIELD | Attending: Gynecologic Oncology | Admitting: Gynecologic Oncology

## 2015-01-15 ENCOUNTER — Encounter: Payer: Self-pay | Admitting: Gynecologic Oncology

## 2015-01-15 ENCOUNTER — Other Ambulatory Visit (HOSPITAL_COMMUNITY)
Admission: RE | Admit: 2015-01-15 | Discharge: 2015-01-15 | Disposition: A | Discharge status: Home / Self Care | Payer: BLUE CROSS/BLUE SHIELD | Source: Ambulatory Visit | Attending: Gynecologic Oncology | Admitting: Gynecologic Oncology

## 2015-01-15 VITALS — BP 133/74 | HR 88 | Temp 98.0°F | Resp 20 | Ht 66.0 in | Wt 159.0 lb

## 2015-01-15 DIAGNOSIS — D069 Carcinoma in situ of cervix, unspecified: Secondary | ICD-10-CM

## 2015-01-15 DIAGNOSIS — R87610 Atypical squamous cells of undetermined significance on cytologic smear of cervix (ASC-US): Secondary | ICD-10-CM | POA: Insufficient documentation

## 2015-01-15 DIAGNOSIS — Z01411 Encounter for gynecological examination (general) (routine) with abnormal findings: Secondary | ICD-10-CM | POA: Diagnosis not present

## 2015-01-15 DIAGNOSIS — Z1151 Encounter for screening for human papillomavirus (HPV): Secondary | ICD-10-CM | POA: Insufficient documentation

## 2015-01-15 DIAGNOSIS — IMO0002 Reserved for concepts with insufficient information to code with codable children: Secondary | ICD-10-CM

## 2015-01-15 NOTE — Progress Notes (Signed)
Consult Note: Gyn-Onc  Wanda Allen 29 y.o. female  CC:  Chief Complaint  Patient presents with  . ASCUS (atypical squamous cells of undeterminded significance    HPI:  Patient is a 29 year old gravida 0 who was seen by Dr. Lemar Livings several times her cervical dysplasia. She went colposcopy and directed biopsies in May 2013 for Pap smear revealed atypical squamous cells of undetermined significance. The biopsies were negative. 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 The patient of note has completed the garden cell vaccine series approximately 3 years ago. She states that Dr. Julien Girt had been encouraging her to followup and the patient did not followup for any additional evaluation or procedures. The patient states she was fearful and did not wish to have any other procedures done at that time. She was fearful of the impact of procedures on her fertility.  She had a Pap smear performed by Dr. 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 I saw her in July. At that time her colposcopy was negative. An endocervical curettage was performed that was negative. She comes in today for followup Pap smear.  I last saw her in Sept.at that time pap smear revealed ASCUS HR-HPV.  She's overall doing fairly well. She had some irregular bleeding in Oct and Nov. And none since. She is in a new relationship now. She is not using any contraception other than condoms. She was seen at the [facility] endocrinology clinic secondary to alopecia. She has a had a workup from TSH etc. that was unremarkable.   Current Meds:  Outpatient Encounter Prescriptions as of 01/15/2015  Medication Sig  . amphetamine-dextroamphetamine (ADDERALL) 30 MG tablet Take 30 mg by mouth daily.   . calcium carbonate (TUMS - DOSED IN MG  ELEMENTAL CALCIUM) 500 MG chewable tablet Chew 1 tablet by mouth as needed.    . NON FORMULARY Supplement: Seven Treasures Formula 2 capsules  . pseudoephedrine (SUDAFED) 30 MG tablet Take 30 mg by mouth as needed.    Marland Kitchen albuterol (PROVENTIL) (2.5 MG/3ML) 0.083% nebulizer solution Take 2.5 mg by nebulization as needed.    . fluticasone (FLONASE) 50 MCG/ACT nasal spray Place 2 sprays into the nose as needed.      Allergy:  Allergies  Allergen Reactions  . Effexor Xr  [Venlafaxine Hcl Er]   . Latex Hives and Rash    Were touched.  . Sulfa Antibiotics Rash  . Sulphur [Sulfur Sublimed] Rash    All over body.    Social Hx:   History   Social History  . Marital Status: Single    Spouse Name: N/A    Number of Children: 0  . Years of Education: N/A   Occupational History  .     Social History Main Topics  . Smoking status: Never Smoker   . Smokeless tobacco: Not on file  . Alcohol Use: 0.0 oz/week     Comment: Social Drinker  . Drug Use: No  . Sexual Activity: Not on file   Other Topics Concern  . Not on file   Social History Narrative    Past Surgical Hx:  Past Surgical History  Procedure Laterality Date  . Mole    . Muscle biopsy    . Mass removal      Past Medical Hx:  Past Medical  History  Diagnosis Date  . Allergy   . Asthma   . Headache disorder 03/01/2011  . Skin moles 03/01/2011  . Fatigue 03/01/2011    Loss of sleep/fatigue  . Insomnia, unspecified 03/01/2011  . Sinus drainage 03/01/2011  . Contact lens/glasses fitting 03/01/2011  . ADD (attention deficit disorder) 03/01/2011  . Joint pain, elbow   . Attention deficit disorder (ADD)   . Fatigue   . Atypical moles   . Joint pain, elbow   . Bronchitis   . Abdominal pain   . Sinus problem   . Generalized headaches     May be due to allergies or medication. Patient is not sure.  . JP drain bleeding     Oncology Hx:   No history exists.    Family Hx:  Family History  Problem Relation Age of Onset  .  Breast cancer    . Other Mother     abnormal pap smears  . Other Brother     tachycardia    Vitals:  Blood pressure 133/74, pulse 88, temperature 98 F (36.7 C), temperature source Oral, resp. rate 20, height 5\' 6"  (1.676 m), weight 159 lb (72.122 kg).  Physical Exam: Well-nourished well-developed female in no acute distress.  Pelvic: Normal external female genitalia notable for some folliculitis. The vagina is well epithelialized no visible lesions. The cervix is visualized is nulliparous. There's a physiologic discharge. There's no visible lesions. Pap smear was submitted without difficulty.   Colposcopic examination was performed after the application acetic acid. There was an irregular area noted at 6:00. After verbal consent was obtained a cervical biopsy at 6:00 was performed. An endocervical curettage was similarly performed. Hemostasis was obtained using silver nitrate. The patient tolerated this fairly well. She did have a little bit of chills and sweats. She did not have a vagal episode.   Assessment/Plan: 29 year old gravida 0 with a history of cervical biopsy on the ectocervix revealing low-grade dysplasia. 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 Colposcopy with ECC was negative in July of this year. She comes in today for followup Pap smear and colposcopy. I will follow-up in results of the Pap smear, cervical biopsy, and endocervical curettage with her next week. She does know that we will call her with the results and determine disposition pending them.  Nancy Marus A., MD 01/15/2015, 12:48 PM

## 2015-01-15 NOTE — Patient Instructions (Signed)
We will contact you with the results of your pap smear, ECC, and biopsy.  Please retrain from intercourse for 24 hours.  Please call for any questions or concerns.

## 2015-01-20 LAB — CYTOLOGY - PAP

## 2015-01-23 ENCOUNTER — Telehealth: Payer: Self-pay | Admitting: Gynecologic Oncology

## 2015-01-23 NOTE — Telephone Encounter (Signed)
Patient calling about results.  EPIC inbox message sent to her by Dr. Alycia Rossetti but patient stating she has been having difficulty seeing information on mychart.  Results discussed along with Dr. Elenora Gamma recommendations for follow up appt in six months with a repeat pap and colpo at that time.  Advised to call for any questions or concerns.

## 2015-07-01 ENCOUNTER — Ambulatory Visit: Payer: BLUE CROSS/BLUE SHIELD | Attending: Gynecologic Oncology | Admitting: Gynecologic Oncology

## 2015-07-01 ENCOUNTER — Other Ambulatory Visit (HOSPITAL_COMMUNITY)
Admission: RE | Admit: 2015-07-01 | Discharge: 2015-07-01 | Disposition: A | Discharge status: Home / Self Care | Payer: BLUE CROSS/BLUE SHIELD | Source: Ambulatory Visit | Attending: Gynecologic Oncology | Admitting: Gynecologic Oncology

## 2015-07-01 ENCOUNTER — Other Ambulatory Visit: Payer: BLUE CROSS/BLUE SHIELD

## 2015-07-01 ENCOUNTER — Encounter: Payer: Self-pay | Admitting: Gynecologic Oncology

## 2015-07-01 VITALS — BP 125/83 | HR 78 | Temp 98.1°F | Resp 20 | Ht 66.0 in | Wt 159.3 lb

## 2015-07-01 DIAGNOSIS — Z1151 Encounter for screening for human papillomavirus (HPV): Secondary | ICD-10-CM | POA: Diagnosis present

## 2015-07-01 DIAGNOSIS — Z01411 Encounter for gynecological examination (general) (routine) with abnormal findings: Secondary | ICD-10-CM | POA: Diagnosis not present

## 2015-07-01 DIAGNOSIS — IMO0002 Reserved for concepts with insufficient information to code with codable children: Secondary | ICD-10-CM

## 2015-07-01 DIAGNOSIS — R896 Abnormal cytological findings in specimens from other organs, systems and tissues: Secondary | ICD-10-CM | POA: Diagnosis not present

## 2015-07-01 MED ORDER — CLINDAMYCIN PHOSPHATE 1 % EX LOTN: TOPICAL_LOTION | Freq: Two times a day (BID) | CUTANEOUS | Status: DC

## 2015-07-01 NOTE — Patient Instructions (Signed)
We will call you with the results of your pap smear and labs. Followup with depend on the results of the pap smear from today. If you have any questions or concerns, please call our office.

## 2015-07-01 NOTE — Progress Notes (Signed)
Consult Note: Gyn-Onc  Wanda Allen 29 y.o. female  CC:  Chief Complaint  Patient presents with  . ASCUS with positive high risk HPV    followup    HPI:  Patient is a 30 year old gravida 0 who was seen by Dr. Lemar Livings several times her cervical dysplasia. She went colposcopy and directed biopsies in May 2013 for Pap smear revealed atypical squamous cells of undetermined significance. The biopsies were negative. 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 The patient of note has completed the garden cell vaccine series approximately 3 years ago. She states that Dr. Julien Girt had been encouraging her to followup and the patient did not followup for any additional evaluation or procedures. The patient states she was fearful and did not wish to have any other procedures done at that time. She was fearful of the impact of procedures on her fertility.  She had a Pap smear performed by Dr. 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 I saw her in July. At that time her colposcopy was negative. An endocervical curettage was performed that was negative. She comes in today for followup Pap smear.  I saw her in Sept.,at that time pap smear revealed ASCUS HR-HPV. She followed up with me in January 2016. At that time she underwent Pap smear and colposcopy. Her Pap smear was negative however colposcopy revealed low-grade dysplasia on a cervical biopsy at 6:00. Endocervical curettage revealed koilocytic atypia consistent with HPV but no dysplasia. She comes in today for follow-up.  She is currently in a one-year long relationship and they're no longer using contraception but are using condoms. She is very tearful today. She states that she is very hormonal her cycles of been irregular. She'll some spotting for  several days and then have bleeding and then spotting again. She had some cramping over the weekend. She does not wish to proceed with oral contraception pills or the To control these symptoms as she would very much like to conceive this and she gets married. She also complains that she sweating more. She's used multiple different types of deodorants and feels that she's voiding more than she normally does. She's been seen by endocrine in the past and they recommend a follow-up in one year. She's having hot flashes at night but states that she is in a cold sweat. She's having increasing acne as well as hidradenitis suppurativa symptoms. This is very frustrating to her.   Current Meds:  Outpatient Encounter Prescriptions as of 07/01/2015  Medication Sig  . amphetamine-dextroamphetamine (ADDERALL) 30 MG tablet Take 30 mg by mouth daily.   . NON FORMULARY PURE magnesium supplement - 2 tablets at bedtime daily  . NON FORMULARY Formula 1 herbal supplement for dietary tract - 2 tablets by mouth daily  . albuterol (PROVENTIL) (2.5 MG/3ML) 0.083% nebulizer solution Take 2.5 mg by nebulization as needed.    . calcium carbonate (TUMS - DOSED IN MG ELEMENTAL CALCIUM) 500 MG chewable tablet Chew 1 tablet by mouth as needed.    . [DISCONTINUED] fluticasone (FLONASE) 50 MCG/ACT nasal spray Place 2 sprays into the nose as needed.    . [DISCONTINUED] NON FORMULARY Supplement: Seven Treasures Formula 2 capsules  . [DISCONTINUED] pseudoephedrine (SUDAFED) 30 MG tablet Take 30 mg by mouth as needed.     No facility-administered encounter  medications on file as of 07/01/2015.    Allergy:  Allergies  Allergen Reactions  . Effexor Xr  [Venlafaxine Hcl Er]   . Latex Hives and Rash    Were touched.  . Sulfa Antibiotics Rash  . Sulphur [Sulfur Sublimed] Rash    All over body.    Social Hx:   History   Social History  . Marital Status: Single    Spouse Name: N/A  . Number of Children: 0  . Years of Education:  N/A   Occupational History  .     Social History Main Topics  . Smoking status: Never Smoker   . Smokeless tobacco: Not on file  . Alcohol Use: 0.0 oz/week     Comment: Social Drinker  . Drug Use: No  . Sexual Activity: Not on file   Other Topics Concern  . Not on file   Social History Narrative    Past Surgical Hx:  Past Surgical History  Procedure Laterality Date  . Mole    . Muscle biopsy    . Mass removal      Past Medical Hx:  Past Medical History  Diagnosis Date  . Allergy   . Asthma   . Headache disorder 03/01/2011  . Skin moles 03/01/2011  . Fatigue 03/01/2011    Loss of sleep/fatigue  . Insomnia, unspecified 03/01/2011  . Sinus drainage 03/01/2011  . Contact lens/glasses fitting 03/01/2011  . ADD (attention deficit disorder) 03/01/2011  . Joint pain, elbow   . Attention deficit disorder (ADD)   . Fatigue   . Atypical moles   . Joint pain, elbow   . Bronchitis   . Abdominal pain   . Sinus problem   . Generalized headaches     May be due to allergies or medication. Patient is not sure.  . JP drain bleeding     Oncology Hx:   No history exists.    Family Hx:  Family History  Problem Relation Age of Onset  . Breast cancer    . Other Mother     abnormal pap smears  . Other Brother     tachycardia    Vitals:  Blood pressure 125/83, pulse 78, temperature 98.1 F (36.7 C), temperature source Oral, resp. rate 20, height 5\' 6"  (1.676 m), weight 159 lb 4.8 oz (72.258 kg), SpO2 100 %.  Physical Exam: Well-nourished well-developed female in no acute distress.  Pelvic: Normal external female genitalia notable for some folliculitis. The vagina is well epithelialized no visible lesions. The cervix is visualized is nulliparous. There's a physiologic discharge. There's no visible lesions. Pap smear was submitted without difficulty.   Colposcopic examination was performed after the application acetic acid. There are no visible lesions. Bimanual examination the  cervix is palpably normal. The uterus is of normal size shape and consistency. There are no adnexal masses.  Assessment/Plan: 29 year old gravida 0 with a history of cervical biopsy on the ectocervix revealing low-grade dysplasia. 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 Colposcopy with ECC was negative in July of this year. Most recently when I saw her Pap smear was normal 6 months ago. ECC was negative with exception of koilocytic atypia and her cervical biopsy revealed low-grade dysplasia. She comes in today for followup Pap smear and colposcopy. I will follow-up in results of the Pap smear as well as FSH, LH, estradiol and TSH to evaluate for the other symptoms she's having. She doesn't we will call her with the  results and determine her chest disposition pending them. Alezandra Egli A., MD 07/01/2015, 10:40 AM

## 2015-07-02 LAB — FOLLICLE STIMULATING HORMONE: FSH: 8.4 m[IU]/mL

## 2015-07-02 LAB — ESTRADIOL: Estradiol: 56.3 pg/mL

## 2015-07-02 LAB — LUTEINIZING HORMONE: LH: 6.7 m[IU]/mL

## 2015-07-03 ENCOUNTER — Telehealth: Payer: Self-pay | Admitting: *Deleted

## 2015-07-03 LAB — CYTOLOGY - PAP

## 2015-07-03 NOTE — Telephone Encounter (Signed)
Notified pt labs normal, call office with any concerns.

## 2015-07-06 NOTE — Telephone Encounter (Signed)
error 

## 2015-08-06 ENCOUNTER — Telehealth: Payer: Self-pay | Admitting: Nurse Practitioner

## 2015-08-06 NOTE — Telephone Encounter (Signed)
Late entry: 08/05/15 RN called at [phone] and [phone] to inform patient per Joylene John, NP f/u with regular gyn in 1 year with PAP; no answer and did not leave VM. RN to continue to try to reach patient.

## 2015-08-07 NOTE — Telephone Encounter (Signed)
Patient returned call and states she will followup with her PCP for GYN care. No other concerns noted at this time - patient instructed to call our office with any questions or concerns in the future.

## 2015-08-07 NOTE — Telephone Encounter (Addendum)
Attempted to reach patient in regards to information noted below. Left VM requesting return call.

## 2016-05-30 ENCOUNTER — Other Ambulatory Visit: Payer: Self-pay | Admitting: Family Medicine

## 2016-05-30 ENCOUNTER — Other Ambulatory Visit (HOSPITAL_COMMUNITY)
Admission: RE | Admit: 2016-05-30 | Discharge: 2016-05-30 | Disposition: A | Discharge status: Home / Self Care | Payer: BLUE CROSS/BLUE SHIELD | Source: Ambulatory Visit | Attending: Family Medicine | Admitting: Family Medicine

## 2016-05-30 DIAGNOSIS — R87619 Unspecified abnormal cytological findings in specimens from cervix uteri: Secondary | ICD-10-CM | POA: Insufficient documentation

## 2016-06-02 LAB — CYTOLOGY - PAP

## 2017-12-17 LAB — OB RESULTS CONSOLE ABO/RH: RH Type: POSITIVE

## 2017-12-17 LAB — OB RESULTS CONSOLE ANTIBODY SCREEN: Antibody Screen: NEGATIVE

## 2017-12-17 LAB — OB RESULTS CONSOLE HIV ANTIBODY (ROUTINE TESTING): HIV: NONREACTIVE

## 2017-12-17 LAB — OB RESULTS CONSOLE HEPATITIS B SURFACE ANTIGEN: Hepatitis B Surface Ag: NEGATIVE

## 2017-12-17 LAB — OB RESULTS CONSOLE GC/CHLAMYDIA
Chlamydia: NEGATIVE
Gonorrhea: NEGATIVE

## 2017-12-17 LAB — OB RESULTS CONSOLE RPR: RPR: NONREACTIVE

## 2017-12-17 LAB — OB RESULTS CONSOLE RUBELLA ANTIBODY, IGM: RUBELLA: IMMUNE

## 2018-07-11 ENCOUNTER — Inpatient Hospital Stay (HOSPITAL_COMMUNITY)
Admission: AD | Admit: 2018-07-11 | Discharge: 2018-07-11 | Disposition: A | Discharge status: Home / Self Care | Payer: 59 | Source: Ambulatory Visit | Attending: Obstetrics & Gynecology | Admitting: Obstetrics & Gynecology

## 2018-07-11 ENCOUNTER — Encounter (HOSPITAL_COMMUNITY): Payer: Self-pay | Admitting: *Deleted

## 2018-07-11 DIAGNOSIS — O479 False labor, unspecified: Secondary | ICD-10-CM | POA: Insufficient documentation

## 2018-07-11 LAB — AMNISURE RUPTURE OF MEMBRANE (ROM) NOT AT ARMC: AMNISURE: NEGATIVE

## 2018-07-11 NOTE — MAU Note (Signed)
Pt had sharp back pain & LLQ pain during the night, got up to get in the tub, had gush of clear fluid @ 0130.  Continues to leak this morning, has been wearing a pad.  Denies bleeding, has some cramping, increased pelvic pressure.  Decreased fetal movement.

## 2018-07-11 NOTE — MAU Note (Signed)
Urine sent to lab 

## 2018-07-11 NOTE — Discharge Instructions (Signed)
Braxton Hicks Contractions °Contractions of the uterus can occur throughout pregnancy, but they are not always a sign that you are in labor. You may have practice contractions called Braxton Hicks contractions. These false labor contractions are sometimes confused with true labor. °What are Braxton Hicks contractions? °Braxton Hicks contractions are tightening movements that occur in the muscles of the uterus before labor. Unlike true labor contractions, these contractions do not result in opening (dilation) and thinning of the cervix. Toward the end of pregnancy (32-34 weeks), Braxton Hicks contractions can happen more often and may become stronger. These contractions are sometimes difficult to tell apart from true labor because they can be very uncomfortable. You should not feel embarrassed if you go to the hospital with false labor. °Sometimes, the only way to tell if you are in true labor is for your health care provider to look for changes in the cervix. The health care provider will do a physical exam and may monitor your contractions. If you are not in true labor, the exam should show that your cervix is not dilating and your water has not broken. °If there are other health problems associated with your pregnancy, it is completely safe for you to be sent home with false labor. You may continue to have Braxton Hicks contractions until you go into true labor. °How to tell the difference between true labor and false labor °True labor °· Contractions last 30-70 seconds. °· Contractions become very regular. °· Discomfort is usually felt in the top of the uterus, and it spreads to the lower abdomen and low back. °· Contractions do not go away with walking. °· Contractions usually become more intense and increase in frequency. °· The cervix dilates and gets thinner. °False labor °· Contractions are usually shorter and not as strong as true labor contractions. °· Contractions are usually irregular. °· Contractions  are often felt in the front of the lower abdomen and in the groin. °· Contractions may go away when you walk around or change positions while lying down. °· Contractions get weaker and are shorter-lasting as time goes on. °· The cervix usually does not dilate or become thin. °Follow these instructions at home: °· Take over-the-counter and prescription medicines only as told by your health care provider. °· Keep up with your usual exercises and follow other instructions from your health care provider. °· Eat and drink lightly if you think you are going into labor. °· If Braxton Hicks contractions are making you uncomfortable: °? Change your position from lying down or resting to walking, or change from walking to resting. °? Sit and rest in a tub of warm water. °? Drink enough fluid to keep your urine pale yellow. Dehydration may cause these contractions. °? Do slow and deep breathing several times an hour. °· Keep all follow-up prenatal visits as told by your health care provider. This is important. °Contact a health care provider if: °· You have a fever. °· You have continuous pain in your abdomen. °Get help right away if: °· Your contractions become stronger, more regular, and closer together. °· You have fluid leaking or gushing from your vagina. °· You pass blood-tinged mucus (bloody show). °· You have bleeding from your vagina. °· You have low back pain that you never had before. °· You feel your baby’s head pushing down and causing pelvic pressure. °· Your baby is not moving inside you as much as it used to. °Summary °· Contractions that occur before labor are called Braxton   Hicks contractions, false labor, or practice contractions. °· Braxton Hicks contractions are usually shorter, weaker, farther apart, and less regular than true labor contractions. True labor contractions usually become progressively stronger and regular and they become more frequent. °· Manage discomfort from Braxton Hicks contractions by  changing position, resting in a warm bath, drinking plenty of water, or practicing deep breathing. °This information is not intended to replace advice given to you by your health care provider. Make sure you discuss any questions you have with your health care provider. °Document Released: 04/27/2017 Document Revised: 04/27/2017 Document Reviewed: 04/27/2017 °Elsevier Interactive Patient Education © 2018 [employer] ° °

## 2018-07-11 NOTE — MAU Note (Signed)
Fern done on Pt, no ferning shown on microscope.

## 2018-07-23 ENCOUNTER — Telehealth (HOSPITAL_COMMUNITY): Payer: Self-pay | Admitting: *Deleted

## 2018-07-23 ENCOUNTER — Encounter (HOSPITAL_COMMUNITY): Payer: Self-pay | Admitting: *Deleted

## 2018-07-23 MED ORDER — TERBUTALINE SULFATE 1 MG/ML IJ SOLN
0.2500 mg | Freq: Once | INTRAMUSCULAR | Status: AC
  Administered 2018-07-24: 0.25 mg via SUBCUTANEOUS
  Filled 2018-07-23 (×2): qty 1

## 2018-07-23 NOTE — Telephone Encounter (Signed)
Preadmission screen  

## 2018-07-23 NOTE — H&P (Signed)
HPI: 32 y/o G1P0 @ [redacted]w[redacted]d estimated gestational age (as dated by LMP c/w 20 week ultrasound) presents for scheduled external cephalic verison due to breech presentation.   no Leaking of Fluid,   no Vaginal Bleeding,   no Uterine Contractions,  + Fetal Movement.  Prenatal care has been provided by Dr. Nelda Marseille  ROS: no HA, no epigastric pain, no visual changes.    Pregnancy complicated by: -Obesity, BMI 39  Prenatal Transfer Tool  Maternal Diabetes: No Genetic Screening: Normal Maternal Ultrasounds/Referrals: Normal Fetal Ultrasounds or other Referrals:  None Maternal Substance Abuse:  No Significant Maternal Medications:  None Significant Maternal Lab Results: Lab values include: Group B Strep negative   PNL:  GBS neg, Rub Immune, Hep B neg, RPR NR, HIV neg, GC/C neg, glucola:79 Hgb: 13.1 Blood type: B pos, antibody neg  Immunizations: Tdap: 6/14 Flu: declined  OBHx: primip PMHx:  PCOS Meds:  PNV Allergy:   Allergies  Allergen Reactions  . Effexor Xr  [Venlafaxine Hcl Er]   . Latex Hives and Rash    Were touched.  . Sulfa Antibiotics Rash  . Sulphur [Sulfur Sublimed] Rash    All over body.   SurgHx: none SocHx:   no Tobacco, no  EtOH, no Illicit Drugs  O: to be obtained Gen. AAOx3, NAD CV.  RRR  No murmur.  Resp. CTAB, no wheeze or crackles. Abd. Gravid,  no tenderness,  no rigidity,  no guarding Extr.  no edema B/L , no calf tenderness, neg Homan's B/L FHT: 145 baseline, mod variability, + accels,  no decels BSUS:  Breech   Labs: see orders  A/P:  32 y.o. G1P0 @ [redacted]w[redacted]d EGA who presents for ECV due to breech presentation -FWB:  NICHD Cat I FHTs -NPO -LR @ 125cc/hr -Terbutaline to bedside -CBC, T&S to be obtained -Risk/benefit reviewed with patient- inform consent obtained  Janyth Pupa, DO [phone] (cell) [phone] (office)

## 2018-07-24 ENCOUNTER — Encounter (HOSPITAL_COMMUNITY): Payer: Self-pay

## 2018-07-24 ENCOUNTER — Observation Stay (HOSPITAL_COMMUNITY)
Admission: RE | Admit: 2018-07-24 | Discharge: 2018-07-24 | Disposition: A | Discharge status: Home / Self Care | Payer: 59 | Source: Ambulatory Visit | Attending: Obstetrics & Gynecology | Admitting: Obstetrics & Gynecology

## 2018-07-24 DIAGNOSIS — O99283 Endocrine, nutritional and metabolic diseases complicating pregnancy, third trimester: Secondary | ICD-10-CM | POA: Insufficient documentation

## 2018-07-24 DIAGNOSIS — Z3A36 36 weeks gestation of pregnancy: Secondary | ICD-10-CM | POA: Diagnosis not present

## 2018-07-24 DIAGNOSIS — O321XX0 Maternal care for breech presentation, not applicable or unspecified: Secondary | ICD-10-CM | POA: Diagnosis not present

## 2018-07-24 DIAGNOSIS — Z882 Allergy status to sulfonamides status: Secondary | ICD-10-CM | POA: Diagnosis not present

## 2018-07-24 DIAGNOSIS — E282 Polycystic ovarian syndrome: Secondary | ICD-10-CM | POA: Insufficient documentation

## 2018-07-24 LAB — TYPE AND SCREEN
ABO/RH(D): B POS
Antibody Screen: NEGATIVE

## 2018-07-24 LAB — CBC
HCT: 36.8 % (ref 36.0–46.0)
Hemoglobin: 12.5 g/dL (ref 12.0–15.0)
MCH: 30.2 pg (ref 26.0–34.0)
MCHC: 34 g/dL (ref 30.0–36.0)
MCV: 88.9 fL (ref 78.0–100.0)
Platelets: 234 10*3/uL (ref 150–400)
RBC: 4.14 MIL/uL (ref 3.87–5.11)
RDW: 13.4 % (ref 11.5–15.5)
WBC: 10.7 10*3/uL — AB (ref 4.0–10.5)

## 2018-07-24 LAB — ABO/RH: ABO/RH(D): B POS

## 2018-07-24 MED ORDER — LACTATED RINGERS IV SOLN: 500.0000 mL | INTRAVENOUS | Status: DC | PRN

## 2018-07-24 MED ORDER — LIDOCAINE HCL (PF) 1 % IJ SOLN
30.0000 mL | INTRAMUSCULAR | Status: DC | PRN
  Filled 2018-07-24: qty 30

## 2018-07-24 MED ORDER — SOD CITRATE-CITRIC ACID 500-334 MG/5ML PO SOLN
ORAL | Status: AC
  Filled 2018-07-24: qty 15

## 2018-07-24 MED ORDER — LACTATED RINGERS IV SOLN
INTRAVENOUS | Status: DC
  Administered 2018-07-24: 08:00:00 via INTRAVENOUS

## 2018-07-24 NOTE — Discharge Instructions (Signed)
Braxton Hicks Contractions °Contractions of the uterus can occur throughout pregnancy, but they are not always a sign that you are in labor. You may have practice contractions called Braxton Hicks contractions. These false labor contractions are sometimes confused with true labor. °What are Braxton Hicks contractions? °Braxton Hicks contractions are tightening movements that occur in the muscles of the uterus before labor. Unlike true labor contractions, these contractions do not result in opening (dilation) and thinning of the cervix. Toward the end of pregnancy (32-34 weeks), Braxton Hicks contractions can happen more often and may become stronger. These contractions are sometimes difficult to tell apart from true labor because they can be very uncomfortable. You should not feel embarrassed if you go to the hospital with false labor. °Sometimes, the only way to tell if you are in true labor is for your health care provider to look for changes in the cervix. The health care provider will do a physical exam and may monitor your contractions. If you are not in true labor, the exam should show that your cervix is not dilating and your water has not broken. °If there are other health problems associated with your pregnancy, it is completely safe for you to be sent home with false labor. You may continue to have Braxton Hicks contractions until you go into true labor. °How to tell the difference between true labor and false labor °True labor °· Contractions last 30-70 seconds. °· Contractions become very regular. °· Discomfort is usually felt in the top of the uterus, and it spreads to the lower abdomen and low back. °· Contractions do not go away with walking. °· Contractions usually become more intense and increase in frequency. °· The cervix dilates and gets thinner. °False labor °· Contractions are usually shorter and not as strong as true labor contractions. °· Contractions are usually irregular. °· Contractions  are often felt in the front of the lower abdomen and in the groin. °· Contractions may go away when you walk around or change positions while lying down. °· Contractions get weaker and are shorter-lasting as time goes on. °· The cervix usually does not dilate or become thin. °Follow these instructions at home: °· Take over-the-counter and prescription medicines only as told by your health care provider. °· Keep up with your usual exercises and follow other instructions from your health care provider. °· Eat and drink lightly if you think you are going into labor. °· If Braxton Hicks contractions are making you uncomfortable: °? Change your position from lying down or resting to walking, or change from walking to resting. °? Sit and rest in a tub of warm water. °? Drink enough fluid to keep your urine pale yellow. Dehydration may cause these contractions. °? Do slow and deep breathing several times an hour. °· Keep all follow-up prenatal visits as told by your health care provider. This is important. °Contact a health care provider if: °· You have a fever. °· You have continuous pain in your abdomen. °Get help right away if: °· Your contractions become stronger, more regular, and closer together. °· You have fluid leaking or gushing from your vagina. °· You pass blood-tinged mucus (bloody show). °· You have bleeding from your vagina. °· You have low back pain that you never had before. °· You feel your baby’s head pushing down and causing pelvic pressure. °· Your baby is not moving inside you as much as it used to. °Summary °· Contractions that occur before labor are called Braxton   Hicks contractions, false labor, or practice contractions. °· Braxton Hicks contractions are usually shorter, weaker, farther apart, and less regular than true labor contractions. True labor contractions usually become progressively stronger and regular and they become more frequent. °· Manage discomfort from Braxton Hicks contractions by  changing position, resting in a warm bath, drinking plenty of water, or practicing deep breathing. °This information is not intended to replace advice given to you by your health care provider. Make sure you discuss any questions you have with your health care provider. °Document Released: 04/27/2017 Document Revised: 04/27/2017 Document Reviewed: 04/27/2017 °Elsevier Interactive Patient Education © 2018 [employer] ° °

## 2018-07-24 NOTE — Procedures (Signed)
External Cephalic Version Procedure Note  Patient Name: Wanda Allen Date of admission: 07/24/18 Date of procedure: 07/24/18  Surgeon: Dr. Janyth Pupa Assistant: Dr. Cyndia Skeeters  Procedure performed: External cephalic version   Procedure description:  After informed consent was obtained a time out was held and the procedure was verified. FHT were monitored prior to the start of the procedure and were found to be NICHD cat I. A BSUS was performed and verified that the infant was frank breech presentation with sacrum posterior and fetal head maternal right. Terbutaline 0.25mg  IV was given prior to the start of the procedure. The breech was attempted to be disengaged from the maternal pelvis and the fetus was rotated in a  backward somersault fashion. Despite two separate attempts including trying to disengage the breech presentation vaginally, the procedure was not a success.  [place] confirmed that there was no movement of the infant's sacrum. After completion of the procedure, fetal heart tones were confirmed to be reassuring.    Janyth Pupa, DO [phone] (cell) [phone] (office)

## 2018-07-25 ENCOUNTER — Telehealth (HOSPITAL_COMMUNITY): Payer: Self-pay | Admitting: *Deleted

## 2018-07-25 ENCOUNTER — Encounter (HOSPITAL_COMMUNITY): Payer: Self-pay

## 2018-07-25 NOTE — Telephone Encounter (Signed)
Preadmission screen  

## 2018-08-01 NOTE — Discharge Summary (Signed)
Physician Discharge Summary  Patient ID: Wanda Allen MRN: [id number] DOB/AGE: February 07, 1986 32 y.o.  Admit date: 07/24/2018 Discharge date: 07/24/2018  Admission Diagnoses: Breech presentation  Discharge Diagnoses: same Active Problems:   * No active hospital problems. *   Discharged Condition: stable  Hospital Course: [name]@[redacted]w[redacted]d  who presents for external cephalic version.  Procedure was performed- see note and pt was discharged home in stable condition.  Consults: None  Significant Diagnostic Studies: angiography: n/a   Treatments: IV hydration  Discharge Exam: Blood pressure 125/70, pulse 90, temperature 98.4 F (36.9 C), temperature source Oral, resp. rate 18, height 5\' 6"  (1.676 m), weight 108.1 kg (238 lb 6.4 oz). Gen. AAOx3, NAD CV.  RRR  No murmur.  Resp. CTAB, no wheeze or crackles. Abd. Gravid,  no tenderness,  no rigidity,  no guarding Extr.  no edema B/L , no calf tenderness, neg Homan's B/L FHT: 145 baseline, mod variability, + accels,  no decels BSUS:  Breech              Gen. AAOx3, NAD CV.  RRR  No murmur.  Resp. CTAB, no wheeze or crackles. Abd. Gravid,  no tenderness,  no rigidity,  no guarding Extr.  no edema B/L , no calf tenderness, neg Homan's B/L FHT: 145 baseline, mod variability, + accels,  no decels BSUS:  Breech                Disposition:    Allergies as of 07/24/2018      Reactions   Effexor Xr [venlafaxine Hcl Er] Other (See Comments)   Unknown, possibly rash    Latex Hives, Rash   Were touched.   Sulfa Antibiotics Rash   Sulphur [sulfur Sublimed] Rash   All over body.      Medication List    TAKE these medications   acetaminophen 500 MG tablet Commonly known as:  TYLENOL Take 1,000 mg by mouth every 6 (six) hours as needed for mild pain.   calcium carbonate 500 MG chewable tablet Commonly known as:  TUMS - dosed in mg elemental calcium Chew 1 tablet by mouth as needed for indigestion.   phenylephrine-shark  liver oil-mineral oil-petrolatum 0.25-3-14-71.9 % rectal ointment Commonly known as:  PREPARATION H Place 1 application rectally 2 (two) times daily as needed for hemorrhoids.   prenatal multivitamin Tabs tablet Take 2 tablets by mouth daily at 12 noon.      Follow-up Information    Wanda Pupa, DO Follow up in 1 week(s).   Specialty:  Obstetrics and Gynecology Contact information: [address] [place] [address] [phone]           Signed: [name] 08/01/2018, 2:53 PM

## 2018-08-07 ENCOUNTER — Encounter (HOSPITAL_COMMUNITY)
Admission: RE | Admit: 2018-08-07 | Discharge: 2018-08-07 | Disposition: A | Discharge status: Home / Self Care | Payer: 59 | Source: Ambulatory Visit | Attending: Obstetrics & Gynecology | Admitting: Obstetrics & Gynecology

## 2018-08-07 HISTORY — DX: Nausea with vomiting, unspecified: R11.2

## 2018-08-07 HISTORY — DX: Other specified postprocedural states: Z98.890

## 2018-08-07 LAB — CBC
HCT: 36.4 % (ref 36.0–46.0)
Hemoglobin: 12.3 g/dL (ref 12.0–15.0)
MCH: 30.2 pg (ref 26.0–34.0)
MCHC: 33.8 g/dL (ref 30.0–36.0)
MCV: 89.4 fL (ref 78.0–100.0)
PLATELETS: 245 10*3/uL (ref 150–400)
RBC: 4.07 MIL/uL (ref 3.87–5.11)
RDW: 13.7 % (ref 11.5–15.5)
WBC: 9.5 10*3/uL (ref 4.0–10.5)

## 2018-08-07 LAB — TYPE AND SCREEN
ABO/RH(D): B POS
Antibody Screen: NEGATIVE

## 2018-08-07 NOTE — Patient Instructions (Signed)
Wanda Allen  08/07/2018   Your procedure is scheduled on:  08/08/2018  Enter through the Main Entrance of [facility] at 1:30 PM.  Pick up the phone at the desk and dial [phone]  Call this number if you have problems the morning of [phone]  Remember:   Do not eat food:(After Midnight) Desps de medianoche.  Do not drink clear liquids: (6 Hours before arrival) 6 horas ante llegada.  Take these medicines the morning of surgery with A SIP OF WATER: none   Do not wear jewelry, make-up or nail polish.  Do not wear lotions, powders, or perfumes. Do not wear deodorant.  Do not shave 48 hours prior to surgery.  Do not bring valuables to the hospital.  [facility] is not   responsible for any belongings or valuables brought to the hospital.  Contacts, dentures or bridgework may not be worn into surgery.  Leave suitcase in the car. After surgery it may be brought to your room.  For patients admitted to the hospital, checkout time is 11:00 AM the day of              discharge.    N/A   Please read over the following fact sheets that you were given:   Surgical Site Infection Prevention

## 2018-08-07 NOTE — H&P (Signed)
HPI: 32 y/o G1P0 @ [redacted]w[redacted]d estimated gestational age (as dated by LMP c/w 20 week ultrasound) presents for scheduled primary C-section   no Leaking of Fluid,   no Vaginal Bleeding,   no Uterine Contractions,  + Fetal Movement.  Prenatal care has been provided by Dr. Nelda Marseille  ROS: no HA, no epigastric pain, no visual changes.    Pregnancy complicated by: -Obesity, BMI 39 -Breech presentation- failed ECV  Prenatal Transfer Tool  Maternal Diabetes: No Genetic Screening: Normal Maternal Ultrasounds/Referrals: Normal Fetal Ultrasounds or other Referrals:  None Maternal Substance Abuse:  No Significant Maternal Medications:  None Significant Maternal Lab Results: Lab values include: Group B Strep negative   PNL:  GBS neg, Rub Immune, Hep B neg, RPR NR, HIV neg, GC/C neg, glucola:79 Hgb: 13.1 Blood type: B pos, antibody neg  Immunizations: Tdap: 6/14 Flu: declined  OBHx: primip PMHx:  PCOS Meds:  PNV Allergy:   Allergies  Allergen Reactions  . Effexor Xr [Venlafaxine Hcl Er] Other (See Comments)    Unknown, possibly rash   . Latex Hives and Rash    Were touched.  . Sulfa Antibiotics Rash  . Sulphur [Sulfur Sublimed] Rash    All over body.   SurgHx: none SocHx:   no Tobacco, no  EtOH, no Illicit Drugs  O: vitals to be obtained Gen. AAOx3, NAD CV.  RRR  No murmur.  Resp. CTAB, no wheeze or crackles. Abd. Gravid,  no tenderness,  no rigidity,  no guarding Extr.  no edema B/L , no calf tenderness, neg Homan's B/L FHT: 145 baseline, mod variability, + accels,  no decels BSUS:  Breech   Labs: see orders  A/P:  32 y.o. G1P0 @ [redacted]w[redacted]d who presents for primary C-section due to breech presentation -FWB:  NICHD Cat I FHTs -NPO -LR @ 125cc/hr -Ancef IV to OR -CBC, T&S to be obtained -Risk/benefit reviewed with patient- inform consent obtained  Janyth Pupa, DO [phone] (cell) [phone] (office)

## 2018-08-08 ENCOUNTER — Inpatient Hospital Stay (HOSPITAL_COMMUNITY)
Admission: AD | Admit: 2018-08-08 | Discharge: 2018-08-11 | DRG: 788 | Disposition: A | Discharge status: Home / Self Care | Payer: 59 | Attending: Obstetrics & Gynecology | Admitting: Obstetrics & Gynecology

## 2018-08-08 ENCOUNTER — Inpatient Hospital Stay (HOSPITAL_COMMUNITY): Payer: 59 | Admitting: Anesthesiology

## 2018-08-08 ENCOUNTER — Other Ambulatory Visit: Payer: Self-pay

## 2018-08-08 ENCOUNTER — Encounter (HOSPITAL_COMMUNITY)
Admission: RE | Disposition: A | Discharge status: Home / Self Care | Payer: Self-pay | Source: Ambulatory Visit | Attending: Obstetrics & Gynecology

## 2018-08-08 ENCOUNTER — Encounter (HOSPITAL_COMMUNITY)
Admission: AD | Disposition: A | Discharge status: Home / Self Care | Payer: Self-pay | Source: Home / Self Care | Attending: Obstetrics & Gynecology

## 2018-08-08 ENCOUNTER — Encounter (HOSPITAL_COMMUNITY): Payer: Self-pay | Admitting: *Deleted

## 2018-08-08 DIAGNOSIS — O99214 Obesity complicating childbirth: Secondary | ICD-10-CM | POA: Diagnosis present

## 2018-08-08 DIAGNOSIS — Z3A39 39 weeks gestation of pregnancy: Secondary | ICD-10-CM

## 2018-08-08 DIAGNOSIS — E669 Obesity, unspecified: Secondary | ICD-10-CM | POA: Diagnosis present

## 2018-08-08 DIAGNOSIS — Z91040 Latex allergy status: Secondary | ICD-10-CM

## 2018-08-08 DIAGNOSIS — O321XX0 Maternal care for breech presentation, not applicable or unspecified: Secondary | ICD-10-CM | POA: Diagnosis present

## 2018-08-08 DIAGNOSIS — Z3490 Encounter for supervision of normal pregnancy, unspecified, unspecified trimester: Secondary | ICD-10-CM

## 2018-08-08 LAB — RPR: RPR Ser Ql: NONREACTIVE

## 2018-08-08 SURGERY — Surgical Case
Anesthesia: Choice

## 2018-08-08 SURGERY — Surgical Case
Anesthesia: Spinal

## 2018-08-08 MED ORDER — LACTATED RINGERS IV SOLN
INTRAVENOUS | Status: DC
  Administered 2018-08-08 (×2): via INTRAVENOUS

## 2018-08-08 MED ORDER — ONDANSETRON HCL 4 MG/2ML IJ SOLN: 4.0000 mg | Freq: Three times a day (TID) | INTRAMUSCULAR | Status: DC | PRN

## 2018-08-08 MED ORDER — NALBUPHINE HCL 10 MG/ML IJ SOLN: 5.0000 mg | INTRAMUSCULAR | Status: DC | PRN

## 2018-08-08 MED ORDER — IBUPROFEN 600 MG PO TABS
600.0000 mg | ORAL_TABLET | Freq: Four times a day (QID) | ORAL | Status: DC
  Administered 2018-08-08 – 2018-08-11 (×11): 600 mg via ORAL
  Filled 2018-08-08 (×11): qty 1

## 2018-08-08 MED ORDER — OXYCODONE HCL 5 MG PO TABS
10.0000 mg | ORAL_TABLET | ORAL | Status: DC | PRN
  Administered 2018-08-10 – 2018-08-11 (×2): 10 mg via ORAL
  Filled 2018-08-08 (×2): qty 2

## 2018-08-08 MED ORDER — SODIUM CHLORIDE 0.9% FLUSH: 3.0000 mL | INTRAVENOUS | Status: DC | PRN

## 2018-08-08 MED ORDER — MEPERIDINE HCL 25 MG/ML IJ SOLN: 6.2500 mg | INTRAMUSCULAR | Status: DC | PRN

## 2018-08-08 MED ORDER — CEFAZOLIN SODIUM-DEXTROSE 2-4 GM/100ML-% IV SOLN
2.0000 g | INTRAVENOUS | Status: AC
  Administered 2018-08-08: 2 g via INTRAVENOUS

## 2018-08-08 MED ORDER — OXYTOCIN 10 UNIT/ML IJ SOLN
INTRAMUSCULAR | Status: AC
Start: 2018-08-08 — End: ?
  Filled 2018-08-08: qty 4

## 2018-08-08 MED ORDER — FENTANYL CITRATE (PF) 100 MCG/2ML IJ SOLN: 25.0000 ug | INTRAMUSCULAR | Status: DC | PRN

## 2018-08-08 MED ORDER — DIPHENHYDRAMINE HCL 25 MG PO CAPS: 25.0000 mg | ORAL_CAPSULE | ORAL | Status: DC | PRN

## 2018-08-08 MED ORDER — ACETAMINOPHEN 325 MG PO TABS
650.0000 mg | ORAL_TABLET | ORAL | Status: DC | PRN
  Administered 2018-08-08 – 2018-08-11 (×11): 650 mg via ORAL
  Filled 2018-08-08 (×11): qty 2

## 2018-08-08 MED ORDER — SODIUM CHLORIDE 0.9 % IR SOLN
Status: DC | PRN
  Administered 2018-08-08: 1

## 2018-08-08 MED ORDER — FENTANYL CITRATE (PF) 100 MCG/2ML IJ SOLN
INTRAMUSCULAR | Status: AC
  Filled 2018-08-08: qty 2

## 2018-08-08 MED ORDER — SENNOSIDES-DOCUSATE SODIUM 8.6-50 MG PO TABS
2.0000 | ORAL_TABLET | ORAL | Status: DC
  Administered 2018-08-08 – 2018-08-11 (×3): 2 via ORAL
  Filled 2018-08-08 (×3): qty 2

## 2018-08-08 MED ORDER — OXYTOCIN 40 UNITS IN LACTATED RINGERS INFUSION - SIMPLE MED: 2.5000 [IU]/h | INTRAVENOUS | Status: AC

## 2018-08-08 MED ORDER — DIPHENHYDRAMINE HCL 25 MG PO CAPS: 25.0000 mg | ORAL_CAPSULE | Freq: Four times a day (QID) | ORAL | Status: DC | PRN

## 2018-08-08 MED ORDER — ACETAMINOPHEN 325 MG PO TABS: 325.0000 mg | ORAL_TABLET | ORAL | Status: DC | PRN

## 2018-08-08 MED ORDER — COCONUT OIL OIL
1.0000 "application " | TOPICAL_OIL | Status: DC | PRN
  Administered 2018-08-10: 1 via TOPICAL
  Filled 2018-08-08: qty 120

## 2018-08-08 MED ORDER — OXYCODONE HCL 5 MG PO TABS
5.0000 mg | ORAL_TABLET | ORAL | Status: DC | PRN
  Administered 2018-08-09 – 2018-08-11 (×10): 5 mg via ORAL
  Filled 2018-08-08 (×10): qty 1

## 2018-08-08 MED ORDER — DIBUCAINE 1 % RE OINT
1.0000 "application " | TOPICAL_OINTMENT | RECTAL | Status: DC | PRN
  Administered 2018-08-10: 1 via RECTAL
  Filled 2018-08-08: qty 28

## 2018-08-08 MED ORDER — OXYCODONE HCL 5 MG/5ML PO SOLN: 5.0000 mg | Freq: Once | ORAL | Status: DC | PRN

## 2018-08-08 MED ORDER — PRENATAL MULTIVITAMIN CH
1.0000 | ORAL_TABLET | Freq: Every day | ORAL | Status: DC
  Filled 2018-08-08: qty 1

## 2018-08-08 MED ORDER — KETOROLAC TROMETHAMINE 30 MG/ML IJ SOLN
30.0000 mg | Freq: Four times a day (QID) | INTRAMUSCULAR | Status: AC | PRN
  Administered 2018-08-08: 30 mg via INTRAMUSCULAR

## 2018-08-08 MED ORDER — PHENYLEPHRINE 8 MG IN D5W 100 ML (0.08MG/ML) PREMIX OPTIME
INJECTION | INTRAVENOUS | Status: AC
  Filled 2018-08-08: qty 100

## 2018-08-08 MED ORDER — NALBUPHINE HCL 10 MG/ML IJ SOLN: 5.0000 mg | Freq: Once | INTRAMUSCULAR | Status: DC | PRN

## 2018-08-08 MED ORDER — SIMETHICONE 80 MG PO CHEW: 80.0000 mg | CHEWABLE_TABLET | ORAL | Status: DC | PRN

## 2018-08-08 MED ORDER — OXYCODONE HCL 5 MG PO TABS
5.0000 mg | ORAL_TABLET | Freq: Once | ORAL | Status: DC | PRN
Start: 2018-08-08 — End: 2018-08-08

## 2018-08-08 MED ORDER — PHENYLEPHRINE 8 MG IN D5W 100 ML (0.08MG/ML) PREMIX OPTIME
INJECTION | INTRAVENOUS | Status: DC | PRN
  Administered 2018-08-08: 60 ug/min via INTRAVENOUS

## 2018-08-08 MED ORDER — MORPHINE SULFATE (PF) 0.5 MG/ML IJ SOLN
INTRAMUSCULAR | Status: DC | PRN
  Administered 2018-08-08: .15 mg via INTRATHECAL

## 2018-08-08 MED ORDER — BUPIVACAINE IN DEXTROSE 0.75-8.25 % IT SOLN
INTRATHECAL | Status: DC | PRN
  Administered 2018-08-08: 1.8 mL via INTRATHECAL

## 2018-08-08 MED ORDER — MORPHINE SULFATE (PF) 0.5 MG/ML IJ SOLN
INTRAMUSCULAR | Status: AC
  Filled 2018-08-08: qty 10

## 2018-08-08 MED ORDER — ZOLPIDEM TARTRATE 5 MG PO TABS: 5.0000 mg | ORAL_TABLET | Freq: Every evening | ORAL | Status: DC | PRN

## 2018-08-08 MED ORDER — DIPHENHYDRAMINE HCL 50 MG/ML IJ SOLN: 12.5000 mg | INTRAMUSCULAR | Status: DC | PRN

## 2018-08-08 MED ORDER — FENTANYL CITRATE (PF) 100 MCG/2ML IJ SOLN
INTRAMUSCULAR | Status: DC | PRN
  Administered 2018-08-08: 15 ug via INTRATHECAL

## 2018-08-08 MED ORDER — KETOROLAC TROMETHAMINE 30 MG/ML IJ SOLN: 30.0000 mg | Freq: Four times a day (QID) | INTRAMUSCULAR | Status: AC | PRN

## 2018-08-08 MED ORDER — MENTHOL 3 MG MT LOZG: 1.0000 | LOZENGE | OROMUCOSAL | Status: DC | PRN

## 2018-08-08 MED ORDER — SIMETHICONE 80 MG PO CHEW
80.0000 mg | CHEWABLE_TABLET | ORAL | Status: DC
  Administered 2018-08-08 – 2018-08-11 (×3): 80 mg via ORAL
  Filled 2018-08-08 (×3): qty 1

## 2018-08-08 MED ORDER — SIMETHICONE 80 MG PO CHEW
80.0000 mg | CHEWABLE_TABLET | Freq: Three times a day (TID) | ORAL | Status: DC
  Administered 2018-08-09 – 2018-08-11 (×6): 80 mg via ORAL
  Filled 2018-08-08 (×6): qty 1

## 2018-08-08 MED ORDER — LACTATED RINGERS IV SOLN
INTRAVENOUS | Status: DC | PRN
  Administered 2018-08-08: 16:00:00 via INTRAVENOUS

## 2018-08-08 MED ORDER — KETOROLAC TROMETHAMINE 30 MG/ML IJ SOLN
INTRAMUSCULAR | Status: AC
  Filled 2018-08-08: qty 1

## 2018-08-08 MED ORDER — OXYTOCIN 10 UNIT/ML IJ SOLN
INTRAVENOUS | Status: DC | PRN
  Administered 2018-08-08: 40 [IU] via INTRAVENOUS

## 2018-08-08 MED ORDER — ONDANSETRON HCL 4 MG/2ML IJ SOLN
INTRAMUSCULAR | Status: AC
  Filled 2018-08-08: qty 2

## 2018-08-08 MED ORDER — SCOPOLAMINE 1 MG/3DAYS TD PT72: 1.0000 | MEDICATED_PATCH | Freq: Once | TRANSDERMAL | Status: DC

## 2018-08-08 MED ORDER — LACTATED RINGERS IV SOLN
INTRAVENOUS | Status: DC
  Administered 2018-08-09: 02:00:00 via INTRAVENOUS

## 2018-08-08 MED ORDER — ONDANSETRON HCL 4 MG/2ML IJ SOLN: 4.0000 mg | Freq: Once | INTRAMUSCULAR | Status: DC | PRN

## 2018-08-08 MED ORDER — DEXAMETHASONE SODIUM PHOSPHATE 10 MG/ML IJ SOLN
INTRAMUSCULAR | Status: DC | PRN
  Administered 2018-08-08: 10 mg via INTRAVENOUS

## 2018-08-08 MED ORDER — NALOXONE HCL 4 MG/10ML IJ SOLN
1.0000 ug/kg/h | INTRAVENOUS | Status: DC | PRN
  Filled 2018-08-08: qty 5

## 2018-08-08 MED ORDER — ACETAMINOPHEN 160 MG/5ML PO SOLN: 325.0000 mg | ORAL | Status: DC | PRN

## 2018-08-08 MED ORDER — DEXAMETHASONE SODIUM PHOSPHATE 10 MG/ML IJ SOLN
INTRAMUSCULAR | Status: AC
  Filled 2018-08-08: qty 1

## 2018-08-08 MED ORDER — NALOXONE HCL 0.4 MG/ML IJ SOLN: 0.4000 mg | INTRAMUSCULAR | Status: DC | PRN

## 2018-08-08 MED ORDER — ONDANSETRON HCL 4 MG/2ML IJ SOLN
INTRAMUSCULAR | Status: DC | PRN
  Administered 2018-08-08: 4 mg via INTRAVENOUS

## 2018-08-08 MED ORDER — WITCH HAZEL-GLYCERIN EX PADS: 1.0000 "application " | MEDICATED_PAD | CUTANEOUS | Status: DC | PRN

## 2018-08-08 SURGICAL SUPPLY — 39 items
ADH SKN CLS APL DERMABOND .7 (GAUZE/BANDAGES/DRESSINGS)
APL SKNCLS STERI-STRIP NONHPOA (GAUZE/BANDAGES/DRESSINGS) ×1
BARRIER ADHS 3X4 INTERCEED (GAUZE/BANDAGES/DRESSINGS) ×1 IMPLANT
BENZOIN TINCTURE PRP APPL 2/3 (GAUZE/BANDAGES/DRESSINGS) ×2 IMPLANT
BRR ADH 4X3 ABS CNTRL BYND (GAUZE/BANDAGES/DRESSINGS) ×1
CHLORAPREP W/TINT 26ML (MISCELLANEOUS) ×2 IMPLANT
CLAMP CORD UMBIL (MISCELLANEOUS) IMPLANT
CLOTH BEACON ORANGE TIMEOUT ST (SAFETY) ×2 IMPLANT
DERMABOND ADVANCED (GAUZE/BANDAGES/DRESSINGS)
DERMABOND ADVANCED .7 DNX12 (GAUZE/BANDAGES/DRESSINGS) IMPLANT
DRSG OPSITE POSTOP 4X10 (GAUZE/BANDAGES/DRESSINGS) ×2 IMPLANT
ELECT REM PT RETURN 9FT ADLT (ELECTROSURGICAL) ×2
ELECTRODE REM PT RTRN 9FT ADLT (ELECTROSURGICAL) ×1 IMPLANT
EXTRACTOR VACUUM KIWI (MISCELLANEOUS) IMPLANT
GLOVE BIOGEL PI IND STRL 7.0 (GLOVE) ×3 IMPLANT
GLOVE BIOGEL PI INDICATOR 7.0 (GLOVE) ×3
GLOVE ECLIPSE 6.5 STRL STRAW (GLOVE) ×2 IMPLANT
GOWN STRL REUS W/TWL LRG LVL3 (GOWN DISPOSABLE) ×6 IMPLANT
KIT ABG SYR 3ML LUER SLIP (SYRINGE) IMPLANT
NDL HYPO 25X5/8 SAFETYGLIDE (NEEDLE) IMPLANT
NEEDLE HYPO 25X5/8 SAFETYGLIDE (NEEDLE) IMPLANT
NS IRRIG 1000ML POUR BTL (IV SOLUTION) ×2 IMPLANT
PACK C SECTION WH (CUSTOM PROCEDURE TRAY) ×2 IMPLANT
PAD ABD 7.5X8 STRL (GAUZE/BANDAGES/DRESSINGS) ×2 IMPLANT
PAD OB MATERNITY 4.3X12.25 (PERSONAL CARE ITEMS) ×2 IMPLANT
PENCIL SMOKE EVAC W/HOLSTER (ELECTROSURGICAL) ×2 IMPLANT
RTRCTR C-SECT PINK 25CM LRG (MISCELLANEOUS) ×2 IMPLANT
STRIP CLOSURE SKIN 1/2X4 (GAUZE/BANDAGES/DRESSINGS) ×2 IMPLANT
SUT PLAIN 0 NONE (SUTURE) IMPLANT
SUT PLAIN 2 0 XLH (SUTURE) IMPLANT
SUT VIC AB 0 CT1 27 (SUTURE) ×4
SUT VIC AB 0 CT1 27XBRD ANBCTR (SUTURE) ×2 IMPLANT
SUT VIC AB 0 CTX 36 (SUTURE) ×6
SUT VIC AB 0 CTX36XBRD ANBCTRL (SUTURE) ×3 IMPLANT
SUT VIC AB 2-0 CT1 27 (SUTURE) ×2
SUT VIC AB 2-0 CT1 TAPERPNT 27 (SUTURE) ×1 IMPLANT
SUT VIC AB 4-0 KS 27 (SUTURE) ×2 IMPLANT
TOWEL OR 17X24 6PK STRL BLUE (TOWEL DISPOSABLE) ×2 IMPLANT
TRAY FOLEY W/BAG SLVR 14FR LF (SET/KITS/TRAYS/PACK) ×1 IMPLANT

## 2018-08-08 NOTE — Anesthesia Preprocedure Evaluation (Signed)
Anesthesia Evaluation  Patient identified by MRN, date of birth, ID band Patient awake    Reviewed: Allergy & Precautions, H&P , NPO status , Patient's Chart, lab work & pertinent test results, reviewed documented beta blocker date and time   History of Anesthesia Complications (+) PONV  Airway Mallampati: II  TM Distance: >3 FB Neck ROM: full    Dental no notable dental hx.    Pulmonary neg pulmonary ROS,    Pulmonary exam normal breath sounds clear to auscultation       Cardiovascular negative cardio ROS Normal cardiovascular exam Rhythm:regular Rate:Normal     Neuro/Psych negative neurological ROS  negative psych ROS   GI/Hepatic negative GI ROS, Neg liver ROS,   Endo/Other  negative endocrine ROS  Renal/GU negative Renal ROS  negative genitourinary   Musculoskeletal   Abdominal   Peds  Hematology negative hematology ROS (+)   Anesthesia Other Findings   Reproductive/Obstetrics (+) Pregnancy                             Anesthesia Physical Anesthesia Plan  ASA: II  Anesthesia Plan: Spinal   Post-op Pain Management:    Induction:   PONV Risk Score and Plan: 2 and Ondansetron, Dexamethasone and Treatment may vary due to age or medical condition  Airway Management Planned:   Additional Equipment:   Intra-op Plan:   Post-operative Plan:   Informed Consent: I have reviewed the patients History and Physical, chart, labs and discussed the procedure including the risks, benefits and alternatives for the proposed anesthesia with the patient or authorized representative who has indicated his/her understanding and acceptance.     Plan Discussed with: CRNA and Anesthesiologist  Anesthesia Plan Comments:         Anesthesia Quick Evaluation

## 2018-08-08 NOTE — Lactation Note (Signed)
This note was copied from a baby's chart. Lactation Consultation Note  Patient Name: Wanda Allen POIPP'G Date: 08/08/2018 Reason for consult: 1st time breastfeeding;Term;Initial assessment P1, 6 hours female infant, C/S delivery Per mom, attended child birth classes with breastfeeding component. Per mom, has Medela DEBP. Per dad, infant had 4 soiled (meconium) diapers and 1 wet diapers. LC help mom latch infant using the football hold, infant's mouth was wide,  top lip flanged and lower jaw down, audible swallowing heard. Latch-8, infant BF -15 minutes. LC discussed I & O. Mom encouraged to feed baby 8-12 times/24 hours and with feeding cues.  Reviewed Baby & Me book's Breastfeeding Basics.  Mom made aware of O/P services, breastfeeding support groups, community resources, and our phone # for post-discharge questions.      Maternal Data Formula Feeding for Exclusion: No Has patient been taught Hand Expression?: Yes Does the patient have breastfeeding experience prior to this delivery?: No  Feeding Feeding Type: Breast Fed Length of feed: 15 min  LATCH Score Latch: Grasps breast easily, tongue down, lips flanged, rhythmical sucking.  Audible Swallowing: A few with stimulation  Type of Nipple: Everted at rest and after stimulation  Comfort (Breast/Nipple): Soft / non-tender  Hold (Positioning): Assistance needed to correctly position infant at breast and maintain latch.  LATCH Score: 8  Interventions Interventions: Support pillows;Adjust position;Skin to skin;Breast massage;Hand express;Assisted with latch;Breast feeding basics reviewed;Breast compression;Position options  Lactation Tools Discussed/Used WIC Program: No   Consult Status Consult Status: Follow-up Date: 08/09/18 Follow-up type: In-patient    Wanda Allen 08/08/2018, 11:13 PM

## 2018-08-08 NOTE — Interval H&P Note (Signed)
History and Physical Interval Note:  08/08/2018 3:29 PM  Wanda Allen  has presented today for surgery, with the diagnosis of * No pre-op diagnosis entered *  The various methods of treatment have been discussed with the patient and family. After consideration of risks, benefits and other options for treatment, the patient has consented to  Procedure(s): CESAREAN SECTION (N/A) as a surgical intervention .  The patient's history has been reviewed, patient examined, no change in status, stable for surgery.  I have reviewed the patient's chart and labs.  Questions were answered to the patient's satisfaction.     [name]

## 2018-08-08 NOTE — Anesthesia Procedure Notes (Signed)
Spinal  Patient location during procedure: OR Start time: 08/08/2018 3:56 PM End time: 08/08/2018 4:00 PM Staffing Anesthesiologist: Janeece Riggers, MD Preanesthetic Checklist Completed: patient identified, site marked, surgical consent, pre-op evaluation, timeout performed, IV checked, risks and benefits discussed and monitors and equipment checked Spinal Block Patient position: sitting Prep: DuraPrep Patient monitoring: heart rate, cardiac monitor, continuous pulse ox and blood pressure Approach: midline Location: L4-5 Injection technique: single-shot Needle Needle type: Sprotte  Needle gauge: 24 G Needle length: 9 cm Assessment Sensory level: T4

## 2018-08-08 NOTE — Anesthesia Postprocedure Evaluation (Signed)
Anesthesia Post Note  Patient: Wanda Allen  Procedure(s) Performed: CESAREAN SECTION (N/A )     Patient location during evaluation: PACU Anesthesia Type: Spinal Level of consciousness: oriented and awake and alert Pain management: pain level controlled Vital Signs Assessment: post-procedure vital signs reviewed and stable Respiratory status: spontaneous breathing, respiratory function stable and patient connected to nasal cannula oxygen Cardiovascular status: blood pressure returned to baseline and stable Postop Assessment: no headache, no backache and no apparent nausea or vomiting Anesthetic complications: no    Last Vitals:  Vitals:   08/08/18 2000 08/08/18 2119  BP: 124/68 120/67  Pulse: 68 72  Resp: 18 18  Temp: (!) 36.4 C 37.2 C  SpO2: 97% 97%    Last Pain:  Vitals:   08/08/18 2119  TempSrc: Oral  PainSc:                  Cordaryl Decelles

## 2018-08-08 NOTE — Transfer of Care (Signed)
Immediate Anesthesia Transfer of Care Note  Patient: Wanda Allen  Procedure(s) Performed: CESAREAN SECTION (N/A )  Patient Location: PACU  Anesthesia Type:Spinal  Level of Consciousness: awake alert oriented  Airway & Oxygen Therapy: Patient Spontanous Breathing  Post-op Assessment: Report given to RN  Post vital signs: Reviewed and stable  Last Vitals:  Vitals Value Taken Time  BP 113/60 08/08/2018  4:59 PM  Temp    Pulse 92 08/08/2018  4:59 PM  Resp 15 08/08/2018  4:59 PM  SpO2 99 % 08/08/2018  4:59 PM    Last Pain:  Vitals:   08/08/18 1409  TempSrc: Oral  PainSc: 5       Patients Stated Pain Goal: 4 (44/73/95 8441)  Complications: No apparent anesthesia complications

## 2018-08-08 NOTE — Op Note (Signed)
PreOp Diagnosis: 1) Intrauterine pregnancy @ 39wk 2) Breech presentation PostOp Diagnosis: same Procedure: Primary C-section Surgeon: Dr. Janyth Pupa Anesthesia: spinal Complications: none EBL: 687cc UOP: 150cc Fluids: 2300cc  Findings: Female infant from frank breech presentation.  Normal uterus, tubes and ovaries bilaterally.  PROCEDURE:  Informed consent was obtained from the patient with risks, benefits, complications, treatment options, and expected outcomes discussed with the patient.  The patient concurred with the proposed plan, giving informed consent with form signed.   The patient was taken to Operating Room, and identified with the procedure verified as C-Section Delivery with Time Out. With induction of anesthesia, the patient was prepped and draped in the usual sterile fashion. A Pfannenstiel incision was made and carried down through the subcutaneous tissue to the fascia. The fascia was incised in the midline and extended transversely. The superior aspect of the fascial incision was grasped with Kochers elevated and the underlying muscle dissected off. The inferior aspect of the facial incision was in similar fashion, grasped elevated and rectus muscles dissected off. The peritoneum was identified and entered. Peritoneal incision was extended longitudinally. The utero-vesical peritoneal reflection was identified and incised transversely with the [facility] scissors, the incision extended laterally, the bladder flap created digitally. A low transverse uterine incision was made.  Upon entry, the sacrum was the presenting part and delivered.  Each leg was flexed, the baby was rotated to allow for delivery of each arm, the head was flexed and delivered.  After the umbilical cord was clamped and cut cord blood was obtained for evaluation.   The placenta was removed intact and appeared normal. The uterine outline, tubes and ovaries appeared normal. The uterine incision was closed with running  locked sutures of 0 Vicryl and a second layer of the same stitch was used in an imbricating fashion.  Excellent hemostasis was obtained.  The pericolic gutters were then cleared of all clots and debris. Interceed was placed.  The peritoneum was reapproximated. The fascia was then reapproximated with running sutures of 0 Vicryl. The subcutaneous tissue was reapproximated with 2-0 plain gut suture.  The skin was closed with 4-0 vicryl in a subcuticular fashion.  Instrument, sponge, and needle counts were correct prior the abdominal closure and at the conclusion of the case. The patient was taken to recovery in stable condition.  Janyth Pupa, DO [phone] (cell) [phone] (office)

## 2018-08-09 ENCOUNTER — Encounter (HOSPITAL_COMMUNITY): Payer: Self-pay | Admitting: *Deleted

## 2018-08-09 LAB — CBC
HEMATOCRIT: 32.1 % — AB (ref 36.0–46.0)
Hemoglobin: 11.1 g/dL — ABNORMAL LOW (ref 12.0–15.0)
MCH: 30.9 pg (ref 26.0–34.0)
MCHC: 34.6 g/dL (ref 30.0–36.0)
MCV: 89.4 fL (ref 78.0–100.0)
Platelets: 239 10*3/uL (ref 150–400)
RBC: 3.59 MIL/uL — AB (ref 3.87–5.11)
RDW: 13.6 % (ref 11.5–15.5)
WBC: 17.5 10*3/uL — AB (ref 4.0–10.5)

## 2018-08-09 MED ORDER — COMPLETENATE 29-1 MG PO CHEW
1.0000 | CHEWABLE_TABLET | Freq: Every day | ORAL | Status: DC
Start: 2018-08-09 — End: 2018-08-11
  Administered 2018-08-09 – 2018-08-10 (×2): 1 via ORAL
  Filled 2018-08-09 (×3): qty 1

## 2018-08-09 NOTE — Addendum Note (Signed)
Addendum  created 08/09/18 0814 by Asher Muir, CRNA   Sign clinical note

## 2018-08-09 NOTE — Progress Notes (Signed)
Subjective: Postop Day 1: Cesarean Delivery No complaints.  Pain controlled.  Lochia normal.  Breast feeding yes.  Does not want circumcision.  Objective: Temp:  [97.5 F (36.4 C)-98.9 F (37.2 C)] 97.5 F (36.4 C) (08/15 0509) Pulse Rate:  [62-92] 62 (08/15 0509) Resp:  [12-19] 16 (08/15 0509) BP: (108-133)/(55-84) 108/55 (08/15 0509) SpO2:  [96 %-100 %] 97 % (08/15 0509) Weight:  [108.5 kg] 108.5 kg (08/14 1409)  Physical Exam: Gen: NAD Lochia: Not visualized Uterine Fundus: firm, appropriately tender Incision: clean, dry small amount of old soilage DVT Evaluation: + Edema present, no calf tenderness bilaterally   Recent Labs    08/07/18 1045 08/09/18 0539  HGB 12.3 11.1*  HCT 36.4 32.1*    Assessment/Plan: Status post C-section-doing well postoperatively. Routine post op care. Encouraged ambulation in halls TID. Lactation support. Anticipate discharge POD #3.    Thurnell Lose 08/09/2018, 7:44 AM

## 2018-08-09 NOTE — Anesthesia Postprocedure Evaluation (Signed)
Anesthesia Post Note  Patient: Wanda Allen  Procedure(s) Performed: CESAREAN SECTION (N/A )     Patient location during evaluation: Mother Baby Anesthesia Type: Spinal Level of consciousness: awake Pain management: satisfactory to patient Vital Signs Assessment: post-procedure vital signs reviewed and stable Respiratory status: spontaneous breathing Cardiovascular status: stable Anesthetic complications: no    Last Vitals:  Vitals:   08/09/18 0100 08/09/18 0509  BP: 130/64 (!) 108/55  Pulse: 70 62  Resp: 18 16  Temp: 37.2 C (!) 36.4 C  SpO2: 96% 97%    Last Pain:  Vitals:   08/09/18 0511  TempSrc:   PainSc: 3    Pain Goal: Patients Stated Pain Goal: 4 (08/08/18 1409)               Casimer Lanius

## 2018-08-10 NOTE — Progress Notes (Signed)
Subjective: Postpartum Day 2: Cesarean Delivery Patient reports incisional pain, tolerating PO, + flatus and no problems voiding.    Objective: Vital signs in last 24 hours: Temp:  [97.5 F (36.4 C)-99.1 F (37.3 C)] 97.5 F (36.4 C) (08/16 0626) Pulse Rate:  [69-81] 78 (08/16 0626) Resp:  [16-20] 16 (08/16 0626) BP: (108-129)/(61-90) 129/90 (08/16 0626) SpO2:  [98 %-99 %] 98 % (08/16 0626)  Physical Exam:  General: alert, cooperative and no distress Lochia: appropriate Uterine Fundus: firm Incision: no significant drainage DVT Evaluation: No evidence of DVT seen on physical exam.  Recent Labs    08/07/18 1045 08/09/18 0539  HGB 12.3 11.1*  HCT 36.4 32.1*    Assessment/Plan: Status post Cesarean section. Doing well postoperatively.  Continue current care Incision pain- continue current meds + abdominal binder  D/c home tomorrow .  Angele Wiemann J. 08/10/2018, 7:59 AM

## 2018-08-11 MED ORDER — OXYCODONE HCL 5 MG PO TABS: 5.0000 mg | ORAL_TABLET | Freq: Four times a day (QID) | ORAL | 0 refills | Status: AC | PRN

## 2018-08-11 MED ORDER — ACETAMINOPHEN 500 MG PO TABS: 500.0000 mg | ORAL_TABLET | Freq: Four times a day (QID) | ORAL | 0 refills | Status: DC | PRN

## 2018-08-11 MED ORDER — IBUPROFEN 600 MG PO TABS: 600.0000 mg | ORAL_TABLET | Freq: Four times a day (QID) | ORAL | 0 refills | Status: DC | PRN

## 2018-08-11 NOTE — Discharge Summary (Addendum)
OB Discharge Summary     Patient Name: Wanda Allen DOB: Jul 27, 1986 MRN: [id number]  Date of admission: 08/08/2018 Delivering MD: Janyth Pupa   Date of discharge: 08/11/2018  Admitting diagnosis: BREECH, FAILED VERSION Intrauterine pregnancy: [redacted]w[redacted]d     Secondary diagnosis:  Active Problems:   Intrauterine pregnancy     Discharge diagnosis: Term Pregnancy Delivered                                                                                                Post partum procedures:n/a  Augmentation: n/a  Complications: None  Hospital course:  Sceduled C/S   32 y.o. yo G1P1001 at [redacted]w[redacted]d was admitted to the hospital 08/08/2018 for scheduled cesarean section with the following indication:Malpresentation.  Membrane Rupture Time/Date: 4:14 PM ,08/08/2018   Patient delivered a Viable infant.08/08/2018  Details of operation can be found in separate operative note.  Pateint had an uncomplicated postpartum course.  She is ambulating, tolerating a regular diet, passing flatus, and urinating well. Patient is discharged home in stable condition on  08/11/18         Physical exam  Vitals:   08/10/18 0626 08/10/18 1534 08/10/18 2257 08/11/18 0612  BP: 129/90 129/84 113/61 119/79  Pulse: 78 79 74 66  Resp: 16 18 18 16   Temp: (!) 97.5 F (36.4 C) 98.2 F (36.8 C) 98.7 F (37.1 C) 97.8 F (36.6 C)  TempSrc: Oral Oral Oral Oral  SpO2: 98%   98%  Weight:      Height:       General: alert, cooperative and no distress Lochia: appropriate Uterine Fundus: firm Incision: Healing well with no significant drainage, No significant erythema, Dressing is clean, dry, and intact DVT Evaluation: No evidence of DVT seen on physical exam. Negative Homan's sign. No cords or calf tenderness. No significant calf/ankle edema. Labs: Lab Results  Component Value Date   WBC 17.5 (H) 08/09/2018   HGB 11.1 (L) 08/09/2018   HCT 32.1 (L) 08/09/2018   MCV 89.4 08/09/2018   PLT 239 08/09/2018    CMP Latest Ref Rng & Units 09/26/2011  Glucose 70 - 99 mg/dL 91  BUN 6 - 23 mg/dL 13  Creatinine 0.50 - 1.10 mg/dL 0.72  Sodium 135 - 145 mEq/L 136  Potassium 3.5 - 5.1 mEq/L 4.8  Chloride 96 - 112 mEq/L 101  CO2 19 - 32 mEq/L 28  Calcium 8.4 - 10.5 mg/dL 9.8    Discharge instruction: per After Visit Summary and "Baby and Me Booklet".  After visit meds:  Allergies as of 08/11/2018      Reactions   Effexor Xr [venlafaxine Hcl Er] Other (See Comments)   Unknown, possibly rash    Latex Hives, Rash   Were touched.   Sulfa Antibiotics Rash   Sulphur [sulfur Sublimed] Rash   All over body.      Medication List    TAKE these medications   acetaminophen 500 MG tablet Commonly known as:  TYLENOL Take 1 tablet (500 mg total) by mouth every 6 (six) hours as needed for mild pain. What changed:  how much to take   calcium carbonate 500 MG chewable tablet Commonly known as:  TUMS - dosed in mg elemental calcium Chew 1 tablet by mouth as needed for indigestion.   ibuprofen 600 MG tablet Commonly known as:  ADVIL,MOTRIN Take 1 tablet (600 mg total) by mouth every 6 (six) hours as needed for mild pain or moderate pain.   oxyCODONE 5 MG immediate release tablet Commonly known as:  Oxy IR/ROXICODONE Take 1 tablet (5 mg total) by mouth every 6 (six) hours as needed for up to 7 days for severe pain.   phenylephrine-shark liver oil-mineral oil-petrolatum 0.25-3-14-71.9 % rectal ointment Commonly known as:  PREPARATION H Place 1 application rectally 2 (two) times daily as needed for hemorrhoids.   prenatal multivitamin Tabs tablet Take 2 tablets by mouth daily at 12 noon.       Diet: routine diet  Activity: Advance as tolerated. Pelvic rest for 6 weeks.   Outpatient follow up:2 weeks Follow up Appt:No future appointments. Follow up Visit:No follow-ups on file.  Postpartum contraception: Undecided  Newborn Data: Live born female  Birth Weight: 7 lb 10.8 oz (3480 g) APGAR:  9, 9  Newborn Delivery   Birth date/time:  08/08/2018 16:14:00 Delivery type:  C-Section, Low Transverse Trial of labor:  No C-section categorization:  Primary     Baby Feeding: Breast Disposition:home with mother   08/11/2018 Marikay Alar, CNM

## 2018-08-11 NOTE — Discharge Instructions (Signed)
Postpartum Care After Cesarean Delivery °The period of time right after you deliver your newborn is called the postpartum period. °What kind of medical care will I receive? °· You may continue to receive fluids and medicines through an IV tube inserted into one of your veins. °· You may have small, flexible tube (catheter) draining urine from your bladder into a bag outside of your body. The catheter will be removed as soon as possible. °· You may be given a squirt bottle to use when you go to the bathroom. You may use this until you are comfortable wiping as usual. To use the squirt bottle, follow these steps: °? Before you urinate, fill the squirt bottle with warm water. The water should be warm. Do not use hot water. °? After you urinate, while you are sitting on the toilet, use the squirt bottle to rinse the area around your urethra and vaginal opening. This rinses away any urine and blood. °? You may do this instead of wiping. As you start healing, you may use the squirt bottle before wiping yourself. Make sure to wipe gently. °? Fill the squirt bottle with clean water every time you use the bathroom. °· You will be given sanitary pads to wear. °· Your incision will be monitored to make sure it is healing properly. You will be told when it is safe for your stitches, staples, or skin adhesive tape to be removed. °What can I expect? °· You may not feel the need to urinate for several hours after delivery. °· You will have some soreness and pain in your abdomen. You may have a small amount of blood or clear fluid coming from your incision. °· If you are breastfeeding, you may have uterine contractions every time you breastfeed for up to several weeks postpartum. Uterine contractions help your uterus return to its normal size. °· It is normal to have vaginal bleeding (lochia) after delivery. The amount and appearance of lochia is often similar to a menstrual period in the first week after delivery. It will  gradually decrease over the next few weeks to a dry, yellow-brown discharge. For most women, lochia stops completely by 6-8 weeks after delivery. Vaginal bleeding can vary from woman to woman. °· Within the first few days after delivery, you may have breast engorgement. This is when your breasts feel heavy, full, and uncomfortable. Your breasts may also throb and feel hard, tightly stretched, warm, and tender. After this occurs, you may have milk leaking from your breasts. Your health care provider can help you relieve discomfort due to breast engorgement. Breast engorgement should go away within a few days. °· You may feel more sad or worried than normal due to hormonal changes after delivery. These feelings should not last more than a few days. If these feelings do not go away after several days, speak with your health care provider. °How should I care for myself? °· Tell your health care provider if you have pain or discomfort. °· Drink enough water to keep your urine clear or pale yellow. °· Wash your hands thoroughly with soap and water for at least 20 seconds after changing your sanitary pads or using the toilet, and before holding or feeding your baby. °· If you are not breastfeeding, avoid touching your breasts a lot. Doing this can make your breasts produce more milk. °· If you become weak or lightheaded, or you feel like you might faint, ask for help before: °? Getting out of bed. °? Showering. °·   Change your sanitary pads frequently. Watch for any changes in your flow, such as a sudden increase in volume, a change in color, or the passing of large blood clots. If you pass a blood clot from your vagina, save it to show to your health care provider. Do not flush blood clots down the toilet without having your health care provider look at them. °· Make sure that all your vaccinations are up to date. This can help protect you and your baby from getting certain diseases. You may need to have immunizations done  before you leave the hospital. °· If desired, talk with your health care provider about methods of family planning or birth control (contraception). °How can I start bonding with my baby? °Spending as much time as possible with your baby is very important. During this time, you and your baby can get to know each other and develop a bond. Having your baby stay with you in your room (rooming in) can give you time to get to know your baby. Rooming in can also help you become comfortable caring for your baby. Breastfeeding can also help you bond with your baby. °How can I plan for returning home with my baby? °· Make sure that you have a car seat installed in your vehicle. °? Your car seat should be checked by a certified car seat installer to make sure that it is installed safely. °? Make sure that your baby fits into the car seat safely. °· Ask your health care provider any questions you have about caring for yourself or your baby. Make sure that you are able to contact your health care provider with any questions after leaving the hospital. °This information is not intended to replace advice given to you by your health care provider. Make sure you discuss any questions you have with your health care provider. °Document Released: 09/05/2012 Document Revised: 05/16/2016 Document Reviewed: 11/16/2015 °Elsevier Interactive Patient Education © 2018 [employer] ° ° °Postpartum Depression and Baby Blues °The postpartum period begins right after the birth of a baby. During this time, there is often a great amount of joy and excitement. It is also a time of many changes in the life of the parents. Regardless of how many times a mother gives birth, each child brings new challenges and dynamics to the family. It is not unusual to have feelings of excitement along with confusing shifts in moods, emotions, and thoughts. All mothers are at risk of developing postpartum depression or the "baby blues." These mood changes can occur  right after giving birth, or they may occur many months after giving birth. The baby blues or postpartum depression can be mild or severe. Additionally, postpartum depression can go away rather quickly, or it can be a long-term condition. °What are the causes? °Raised hormone levels and the rapid drop in those levels are thought to be a main cause of postpartum depression and the baby blues. A number of hormones change during and after pregnancy. Estrogen and progesterone usually decrease right after the delivery of your baby. The levels of thyroid hormone and various cortisol steroids also rapidly drop. Other factors that play a role in these mood changes include major life events and genetics. °What increases the risk? °If you have any of the following risks for the baby blues or postpartum depression, know what symptoms to watch out for during the postpartum period. Risk factors that may increase the likelihood of getting the baby blues or postpartum depression include: °·   Having a personal or family history of depression. °· Having depression while being pregnant. °· Having premenstrual mood issues or mood issues related to oral contraceptives. °· Having a lot of life stress. °· Having marital conflict. °· Lacking a social support network. °· Having a baby with special needs. °· Having health problems, such as diabetes. ° °What are the signs or symptoms? °Symptoms of baby blues include: °· Brief changes in mood, such as going from extreme happiness to sadness. °· Decreased concentration. °· Difficulty sleeping. °· Crying spells, tearfulness. °· Irritability. °· Anxiety. ° °Symptoms of postpartum depression typically begin within the first month after giving birth. These symptoms include: °· Difficulty sleeping or excessive sleepiness. °· Marked weight loss. °· Agitation. °· Feelings of worthlessness. °· Lack of interest in activity or food. ° °Postpartum psychosis is a very serious condition and can be  dangerous. Fortunately, it is rare. Displaying any of the following symptoms is cause for immediate medical attention. Symptoms of postpartum psychosis include: °· Hallucinations and delusions. °· Bizarre or disorganized behavior. °· Confusion or disorientation. ° °How is this diagnosed? °A diagnosis is made by an evaluation of your symptoms. There are no medical or lab tests that lead to a diagnosis, but there are various questionnaires that a health care provider may use to identify those with the baby blues, postpartum depression, or psychosis. Often, a screening tool called the Edinburgh Postnatal Depression Scale is used to diagnose depression in the postpartum period. °How is this treated? °The baby blues usually goes away on its own in 1-2 weeks. Social support is often all that is needed. You will be encouraged to get adequate sleep and rest. Occasionally, you may be given medicines to help you sleep. °Postpartum depression requires treatment because it can last several months or longer if it is not treated. Treatment may include individual or group therapy, medicine, or both to address any social, physiological, and psychological factors that may play a role in the depression. Regular exercise, a healthy diet, rest, and social support may also be strongly recommended. °Postpartum psychosis is more serious and needs treatment right away. Hospitalization is often needed. °Follow these instructions at home: °· Get as much rest as you can. Nap when the baby sleeps. °· Exercise regularly. Some women find yoga and walking to be beneficial. °· Eat a balanced and nourishing diet. °· Do little things that you enjoy. Have a cup of tea, take a bubble bath, read your favorite magazine, or listen to your favorite music. °· Avoid alcohol. °· Ask for help with household chores, cooking, grocery shopping, or running errands as needed. Do not try to do everything. °· Talk to people close to you about how you are feeling.  Get support from your partner, family members, friends, or other new moms. °· Try to stay positive in how you think. Think about the things you are grateful for. °· Do not spend a lot of time alone. °· Only take over-the-counter or prescription medicine as directed by your health care provider. °· Keep all your postpartum appointments. °· Let your health care provider know if you have any concerns. °Contact a health care provider if: °You are having a reaction to or problems with your medicine. °Get help right away if: °· You have suicidal feelings. °· You think you may harm the baby or someone else. °This information is not intended to replace advice given to you by your health care provider. Make sure you discuss any questions you have   with your health care provider. Document Released: 09/15/2004 Document Revised: 05/19/2016 Document Reviewed: 09/23/2013 Elsevier Interactive Patient Education  2017 [employer].   Contraception Choices Contraception, also called birth control, means things to use or ways to try not to get pregnant. Hormonal birth control This kind of birth control uses hormones. Here are some types of hormonal birth control:  A tube that is put under skin of the arm (implant). The tube can stay in for as long as 3 years.  Shots to get every 3 months (injections).  Pills to take every day (birth control pills).  A patch to change 1 time each week for 3 weeks (birth control patch). After that, the patch is taken off for 1 week.  A ring to put in the vagina. The ring is left in for 3 weeks. Then it is taken out of the vagina for 1 week. Then a new ring is put in.  Pills to take after unprotected sex (emergency birth control pills).  Barrier birth control Here are some types of barrier birth control:  A thin covering that is put on the penis before sex (female condom). The covering is thrown away after sex.  A soft, loose covering that is put in the vagina before sex (female  condom). The covering is thrown away after sex.  A rubber bowl that sits over the cervix (diaphragm). The bowl must be made for you. The bowl is put into the vagina before sex. The bowl is left in for 6-8 hours after sex. It is taken out within 24 hours.  A small, soft cup that fits over the cervix (cervical cap). The cup must be made for you. The cup can be left in for 6-8 hours after sex. It is taken out within 48 hours.  A sponge that is put into the vagina before sex. It must be left in for at least 6 hours after sex. It must be taken out within 30 hours. Then it is thrown away.  A chemical that kills or stops sperm from getting into the uterus (spermicide). It may be a pill, cream, jelly, or foam to put in the vagina. The chemical should be used at least 10-15 minutes before sex.  IUD (intrauterine) birth control An IUD is a small, T-shaped piece of plastic. It is put inside the uterus. There are two kinds:  Hormone IUD. This kind can stay in for 3-5 years.  Copper IUD. This kind can stay in for 10 years.  Permanent birth control Here are some types of permanent birth control:  Surgery to block the fallopian tubes.  Having an insert put into each fallopian tube.  Surgery to tie off the tubes that carry sperm (vasectomy).  Natural planning birth control Here are some types of natural planning birth control:  Not having sex on the days the woman could get pregnant.  Using a calendar: ? To keep track of the length of each period. ? To find out what days pregnancy can happen. ? To plan to not have sex on days when pregnancy can happen.  Watching for symptoms of ovulation and not having sex during ovulation. One way the woman can check for ovulation is to check her temperature.  Waiting to have sex until after ovulation.  Summary  Contraception, also called birth control, means things to use or ways to try not to get pregnant.  Hormonal methods of birth control include  implants, injections, pills, patches, vaginal rings, and emergency birth control  pills.  Barrier methods of birth control can include female condoms, female condoms, diaphragms, cervical caps, sponges, and spermicides.  There are two types of IUD (intrauterine device) birth control. An IUD can be put in a woman's uterus to prevent pregnancy for 3-5 years.  Permanent sterilization can be done through a procedure for males, females, or both.  Natural planning methods involve not having sex on the days when the woman could get pregnant. This information is not intended to replace advice given to you by your health care provider. Make sure you discuss any questions you have with your health care provider. Document Released: 10/09/2009 Document Revised: 12/22/2016 Document Reviewed: 12/22/2016 Elsevier Interactive Patient Education  2017 [employer].

## 2018-08-11 NOTE — Lactation Note (Signed)
This note was copied from a baby's chart. Lactation Consultation Note  Patient Name: Boy Beth[place] Langstaff SAYTK'Z Date: 08/11/2018 Reason for consult: Follow-up assessment;Infant weight loss   P1, Baby 66 hours old with 8.5% weight loss.  Stools/voids WNL. Mother's breasts are filling.  Encouraged her to latch baby and massage hard/full areas during feeding. Observed latch in cradle which LC helped covert to cross cradle for more depth. Massaged and compressed breast during feeding. Observed football position also. Mother was worried about her milk supply so she started by choice a slow flow nipple bottle with breastmilk. Discussed pumping and going back to work. Mom encouraged to feed baby 8-12 times/24 hours and with feeding cues.  Reviewed engorgement care and monitoring voids/stools.    Maternal Data    Feeding Feeding Type: Breast Fed  LATCH Score Latch: Grasps breast easily, tongue down, lips flanged, rhythmical sucking.  Audible Swallowing: A few with stimulation  Type of Nipple: Everted at rest and after stimulation  Comfort (Breast/Nipple): Soft / non-tender  Hold (Positioning): Assistance needed to correctly position infant at breast and maintain latch.  LATCH Score: 8  Interventions Interventions: Breast feeding basics reviewed;Assisted with latch;Skin to skin;Breast compression;Support pillows;Adjust position  Lactation Tools Discussed/Used     Consult Status Consult Status: Complete    Carlye Grippe 08/11/2018, 11:16 AM

## 2020-08-14 ENCOUNTER — Other Ambulatory Visit: Payer: Self-pay

## 2020-08-14 DIAGNOSIS — Z20822 Contact with and (suspected) exposure to covid-19: Secondary | ICD-10-CM

## 2020-08-15 LAB — SARS-COV-2, NAA 2 DAY TAT

## 2020-08-15 LAB — NOVEL CORONAVIRUS, NAA: SARS-CoV-2, NAA: NOT DETECTED

## 2020-08-18 ENCOUNTER — Other Ambulatory Visit: Payer: 59

## 2020-08-18 DIAGNOSIS — Z20822 Contact with and (suspected) exposure to covid-19: Secondary | ICD-10-CM

## 2020-08-20 LAB — SARS-COV-2, NAA 2 DAY TAT

## 2020-08-20 LAB — NOVEL CORONAVIRUS, NAA: SARS-CoV-2, NAA: NOT DETECTED

## 2020-08-30 ENCOUNTER — Ambulatory Visit (INDEPENDENT_AMBULATORY_CARE_PROVIDER_SITE_OTHER): Payer: 59

## 2020-08-30 ENCOUNTER — Encounter: Payer: Self-pay | Admitting: *Deleted

## 2020-08-30 ENCOUNTER — Ambulatory Visit
Admission: EM | Admit: 2020-08-30 | Discharge: 2020-08-30 | Disposition: A | Discharge status: Home / Self Care | Payer: 59

## 2020-08-30 ENCOUNTER — Other Ambulatory Visit: Payer: Self-pay

## 2020-08-30 DIAGNOSIS — U071 COVID-19: Secondary | ICD-10-CM

## 2020-08-30 DIAGNOSIS — J1282 Pneumonia due to coronavirus disease 2019: Secondary | ICD-10-CM

## 2020-08-30 DIAGNOSIS — R042 Hemoptysis: Secondary | ICD-10-CM | POA: Diagnosis not present

## 2020-08-30 DIAGNOSIS — R0602 Shortness of breath: Secondary | ICD-10-CM

## 2020-08-30 HISTORY — DX: COVID-19: U07.1

## 2020-08-30 MED ORDER — FLUCONAZOLE 150 MG PO TABS: 150.0000 mg | ORAL_TABLET | Freq: Every day | ORAL | 0 refills | Status: DC

## 2020-08-30 MED ORDER — DOXYCYCLINE HYCLATE 100 MG PO CAPS: 100.0000 mg | ORAL_CAPSULE | Freq: Two times a day (BID) | ORAL | 0 refills | Status: DC

## 2020-08-30 MED ORDER — PREDNISONE 50 MG PO TABS: 50.0000 mg | ORAL_TABLET | Freq: Every day | ORAL | 0 refills | Status: DC

## 2020-08-30 NOTE — ED Triage Notes (Addendum)
Reports having had positive Covid tests 08/19/20.  States she presents with continued "extreme weakness", cough, random chest pain.  States has started having coughing fits and dyspnea, and vertigo.  Has been using inhaler prn.

## 2020-08-30 NOTE — ED Provider Notes (Signed)
[facility]    CSN: [id number] Arrival date & time: 08/30/20  1147      History   Chief Complaint No chief complaint on file.   HPI Wanda Allen is a 34 y.o. female.   34 year old female who was positive for COVID 08/19/2020 comes in for continued symptoms. Having weakness, cough, shortness of breath, vertigo. Chest pain that is intermittent, worse with cough. States cough can now be productive with some blood tinged sputum. Had fever (tmax 101), resolved 08/21/2020. Have been using albuterol with minimal relief. Never smoker.      Past Medical History:  Diagnosis Date  . Abdominal pain   . ADD (attention deficit disorder) 03/01/2011  . ADD (attention deficit disorder)   . Allergy   . Asthma   . Attention deficit disorder (ADD)   . Atypical moles   . Bronchitis   . Cancer ([place])    melanoma R knee  . Contact lens/glasses fitting 03/01/2011  . COVID-19   . Fatigue 03/01/2011   Loss of sleep/fatigue  . Fatigue   . Generalized headaches    May be due to allergies or medication. Patient is not sure.  Marland Kitchen Headache disorder 03/01/2011  . Insomnia, unspecified 03/01/2011  . Joint pain, elbow   . Joint pain, elbow   . JP drain bleeding   . PCOS (polycystic ovarian syndrome)   . PONV (postoperative nausea and vomiting)   . Sinus drainage 03/01/2011  . Sinus problem   . Skin moles 03/01/2011    Patient Active Problem List   Diagnosis Date Noted  . Intrauterine pregnancy 08/08/2018  . Abnormal Pap smear of cervix 07/10/2014  . Abdominal mass, left lower quadrant, possible hernia 08/01/2011    Past Surgical History:  Procedure Laterality Date  . CESAREAN SECTION N/A 08/08/2018   Procedure: CESAREAN SECTION;  Surgeon: Janyth Pupa, DO;  Location: [facility];  Service: Obstetrics;  Laterality: N/A;  . mass removal    . mole    . MUSCLE BIOPSY      OB History    Gravida  1   Para  1   Term  1   Preterm      AB      Living  1     SAB       TAB      Ectopic      Multiple  0   Live Births  1            Home Medications    Prior to Admission medications   Medication Sig Start Date End Date Taking? Authorizing Provider  albuterol (VENTOLIN HFA) 108 (90 Base) MCG/ACT inhaler Inhale into the lungs every 6 (six) hours as needed for wheezing or shortness of breath.   Yes [provider]  Amphetamine-Dextroamphetamine (ADDERALL PO) Take by mouth.   Yes [provider]  etonogestrel-ethinyl estradiol (NUVARING) 0.12-0.015 MG/24HR vaginal ring Place 1 each vaginally every 28 (twenty-eight) days. Insert vaginally and leave in place for 3 consecutive weeks, then remove for 1 week.   Yes [provider]  ibuprofen (ADVIL,MOTRIN) 600 MG tablet Take 1 tablet (600 mg total) by mouth every 6 (six) hours as needed for mild pain or moderate pain. 08/11/18  Yes Marikay Alar, CNM  doxycycline (VIBRAMYCIN) 100 MG capsule Take 1 capsule (100 mg total) by mouth 2 (two) times daily. 08/30/20   Tasia Catchings, Iori Gigante V, PA-C  predniSONE (DELTASONE) 50 MG tablet Take 1 tablet (  50 mg total) by mouth daily with breakfast. 08/30/20   Ok Edwards, PA-C    Family History Family History  Problem Relation Age of Onset  . Other Mother        abnormal pap smears  . Cancer Father   . Other Brother        tachycardia  . Breast cancer Other   . Hypothyroidism Maternal Aunt   . Cancer Maternal Grandmother   . Lung cancer Maternal Grandfather   . Diabetes Maternal Grandfather   . Heart disease Paternal Grandmother   . Bladder Cancer Paternal Grandfather     Social History Social History   Tobacco Use  . Smoking status: Never Smoker  . Smokeless tobacco: Never Used  Vaping Use  . Vaping Use: Never used  Substance Use Topics  . Alcohol use: Not Currently  . Drug use: No     Allergies   Effexor xr [venlafaxine hcl er], Latex, Sulfa antibiotics, and Sulphur [sulfur sublimed]   Review of Systems Review of Systems  Reason unable  to perform ROS: See HPI as above.     Physical Exam Triage Vital Signs ED Triage Vitals  Enc Vitals Group     BP 08/30/20 1259 132/87     Pulse Rate 08/30/20 1259 97     Resp 08/30/20 1259 18     Temp 08/30/20 1259 98.7 F (37.1 C)     Temp Source 08/30/20 1259 Oral     SpO2 08/30/20 1259 95 %     Weight --      Height --      Head Circumference --      Peak Flow --      Pain Score 08/30/20 1309 3     Pain Loc --      Pain Edu? --      Excl. in [place]? --    No data found.  Updated Vital Signs BP 132/87 (BP Location: Right Arm)   Pulse 97   Temp 98.7 F (37.1 C) (Oral)   Resp 18   LMP 08/14/2020 (Exact Date)   SpO2 95%   Breastfeeding No   Physical Exam Constitutional:      General: She is not in acute distress.    Appearance: Normal appearance. She is well-developed. She is not toxic-appearing or diaphoretic.  HENT:     Head: Normocephalic and atraumatic.  Eyes:     Conjunctiva/sclera: Conjunctivae normal.     Pupils: Pupils are equal, round, and reactive to light.  Cardiovascular:     Rate and Rhythm: Normal rate and regular rhythm.  Pulmonary:     Effort: Pulmonary effort is normal. No respiratory distress.     Comments: LCTAB Chest:     Comments: Chest tenderness to palpation Musculoskeletal:     Cervical back: Normal range of motion and neck supple.  Skin:    General: Skin is warm and dry.  Neurological:     Mental Status: She is alert and oriented to person, place, and time.      UC Treatments / Results  Labs (all labs ordered are listed, but only abnormal results are displayed) Labs Reviewed - No data to display  EKG   Radiology DG Chest 2 View  Result Date: 08/30/2020 CLINICAL DATA:  Recent diagnosis of COVID infection now with shortness of breath and blood-tinged mucus. EXAM: CHEST - 2 VIEW COMPARISON:  09/26/2011 FINDINGS: Grossly unchanged cardiac silhouette and mediastinal contours. Scattered bilateral nodular heterogeneous airspace  opacities  within the bilateral mid and right lower lung. No pleural effusion or pneumothorax. No evidence of edema. No acute osseous abnormalities. IMPRESSION: Bilateral nodular heterogeneous airspace opacities compatible with provided history of COVID-19 infection Electronically Signed   By: Sandi Mariscal M.D.   On: 08/30/2020 13:56    Procedures Procedures (including critical care time)  Medications Ordered in UC Medications - No data to display  Initial Impression / Assessment and Plan / UC Course  I have reviewed the triage vital signs and the nursing notes.  Pertinent labs & imaging results that were available during my care of the patient were reviewed by me and considered in my medical decision making (see chart for details).    Discussed chest x-ray results with patient.  Consistent with Covid pneumonia.  Due to length of symptoms, will start doxycycline for possible superimposed bacterial infection.  Otherwise prednisone, albuterol as directed.  Other symptomatic treatment discussed.  Return precautions given.  Patient expresses understanding and agrees to plan.  Final Clinical Impressions(s) / UC Diagnoses   Final diagnoses:  Pneumonia due to COVID-19 virus   ED Prescriptions    Medication Sig Dispense Auth. Provider   predniSONE (DELTASONE) 50 MG tablet Take 1 tablet (50 mg total) by mouth daily with breakfast. 5 tablet Kimyetta Flott V, PA-C   doxycycline (VIBRAMYCIN) 100 MG capsule Take 1 capsule (100 mg total) by mouth 2 (two) times daily. 14 capsule Tasia Catchings, Angelyn Osterberg V, PA-C     I have reviewed the PDMP during this encounter.   Ok Edwards, PA-C 08/30/20 1419

## 2020-08-30 NOTE — Discharge Instructions (Signed)
As discussed, chest x-ray shows pneumonia consistent with Covid pneumonia.  Given length of symptoms, will cover with antibiotics for possible bacterial infection.  However, this could still be viral.  Start doxycycline as directed.  Start prednisone as directed.  Continue albuterol as needed.  Can use meclizine for dizziness if needed. Keep hydrated, urine should be clear to pale yellow in color.  Can follow-up with PCP or [place] for further evaluation and management needed.  If worsening symptoms, go to the emergency department for further evaluation.

## 2021-05-20 ENCOUNTER — Telehealth: Payer: Self-pay | Admitting: Obstetrics & Gynecology

## 2021-05-20 ENCOUNTER — Other Ambulatory Visit: Payer: Self-pay

## 2021-05-20 NOTE — Telephone Encounter (Signed)
Patient called stating that she will run out of her [facility] medication before her appointment with Dr. Nelda Marseille for her pap which is 08/09/2021. Pt states that she would like some sent until her appointment, she uses [facility] on [address]. Please contact pt when sent

## 2021-05-27 MED ORDER — ETONOGESTREL-ETHINYL ESTRADIOL 0.12-0.015 MG/24HR VA RING: 1.0000 | VAGINAL_RING | VAGINAL | 4 refills | Status: DC

## 2021-08-09 ENCOUNTER — Other Ambulatory Visit: Payer: Self-pay

## 2021-08-09 ENCOUNTER — Ambulatory Visit (INDEPENDENT_AMBULATORY_CARE_PROVIDER_SITE_OTHER): Payer: 59 | Admitting: Obstetrics & Gynecology

## 2021-08-09 ENCOUNTER — Other Ambulatory Visit (HOSPITAL_COMMUNITY)
Admission: RE | Admit: 2021-08-09 | Discharge: 2021-08-09 | Disposition: A | Discharge status: Home / Self Care | Payer: 59 | Source: Ambulatory Visit | Attending: Obstetrics & Gynecology | Admitting: Obstetrics & Gynecology

## 2021-08-09 ENCOUNTER — Encounter: Payer: Self-pay | Admitting: Obstetrics & Gynecology

## 2021-08-09 VITALS — BP 138/81 | HR 63 | Ht 66.5 in | Wt 162.4 lb

## 2021-08-09 DIAGNOSIS — Z01419 Encounter for gynecological examination (general) (routine) without abnormal findings: Secondary | ICD-10-CM | POA: Insufficient documentation

## 2021-08-09 DIAGNOSIS — R5383 Other fatigue: Secondary | ICD-10-CM

## 2021-08-09 DIAGNOSIS — Z3044 Encounter for surveillance of vaginal ring hormonal contraceptive device: Secondary | ICD-10-CM | POA: Diagnosis not present

## 2021-08-09 MED ORDER — ETONOGESTREL-ETHINYL ESTRADIOL 0.12-0.015 MG/24HR VA RING: 1.0000 | VAGINAL_RING | VAGINAL | 4 refills | Status: DC

## 2021-08-09 NOTE — Progress Notes (Signed)
WELL-WOMAN EXAMINATION Patient name: Wanda Allen MRN [id number]  Date of birth: 09/02/1986 Chief Complaint:   Gynecologic Exam  History of Present Illness:   Wanda Allen is a 35 y.o. G59P1001 female being seen today for a routine well-woman exam.  Today she notes: some irregular bleeding after forgetting to remove her last ring.  She has since started a new ring  Family planning: Still with husband though they are not sleeping together.  No sex since Feb.  In review, husband was unfaithful, in counseling, but still a process.  She would desires another pregnancy.  Her focus right now is work and taking care of Wanda Allen.  She does note significant fatigue, this has been ongoing since she had COVID last year.  She also noted some hair loss, which she attributed to forgetting her ring- per pt this has been getting better.  She otherwise reports no acute complaints   Patient's last menstrual period was 07/19/2021. Denies issues with her menses The current method of family planning is NuvaRing vaginal inserts.    Last pap 2017.  Last mammogram: n/a. Last colonoscopy: n/a  Depression screen [facility] 2/9 08/09/2021  Decreased Interest 2  Down, Depressed, Hopeless 1  PHQ - 2 Score 3  Altered sleeping 3  Tired, decreased energy 3  Change in appetite 2  Feeling bad or failure about yourself  0  Trouble concentrating 2  Moving slowly or fidgety/restless 0  Suicidal thoughts 0  PHQ-9 Score 13      Review of Systems:   Pertinent items are noted in HPI Denies any headaches, blurred vision, fatigue, shortness of breath, chest pain, abdominal pain, bowel movements, urination, or intercourse unless otherwise stated above.  Pertinent History Reviewed:  Reviewed past medical,surgical, social and family history.  Reviewed problem list, medications and allergies. Physical Assessment:   Vitals:   08/09/21 1026  BP: 138/81  Pulse: 63  Weight: 162 lb 6.4 oz (73.7 kg)  Height: 5'  6.5" (1.689 m)  Body mass index is 25.82 kg/m.        Physical Examination:   General appearance - well appearing, and in no distress  Mental status - alert, oriented to person, place, and time  Psych:  She has a normal mood and affect  Skin - warm and dry, normal color, no suspicious lesions noted  Chest - effort normal, all lung fields clear to auscultation bilaterally  Heart - normal rate and regular rhythm  Neck:  midline trachea, no thyromegaly or nodules  Breasts - breasts appear normal, no suspicious masses, no skin or nipple changes or  axillary nodes  Abdomen - soft, nontender, nondistended, no masses or organomegaly  Pelvic - VULVA: normal appearing vulva with no masses, tenderness or lesions  VAGINA: normal appearing vagina with normal color and discharge, no lesions  CERVIX: normal appearing cervix without discharge or lesions, no CMT  Thin prep pap is done with HR HPV cotesting  UTERUS: uterus is felt to be normal size, shape, consistency and nontender   ADNEXA: No adnexal masses or tenderness noted.  Extremities:  No swelling or varicosities noted  Chaperone: Celene Squibb     Assessment & Plan:  1) Well-Woman Exam -pap collected, STI screening to be completed  2) Contraceptive management -continue with ring, reassured pt that some irregular bleeding can be common when missed ring removal -may consider pregnancy in the future, not now  3) Fatigue -plan to check TSH level -suspect some of this may be  due to anxiety/social stressors, pt encouraged to continue with counseling Orders Placed This Encounter  Procedures   TSH    Meds:  Meds ordered this encounter  Medications   etonogestrel-ethinyl estradiol (NUVARING) 0.12-0.015 MG/24HR vaginal ring    Sig: Place 1 each vaginally every 28 (twenty-eight) days. Insert vaginally and leave in place for 3 consecutive weeks, then remove for 1 week.    Dispense:  3 each    Refill:  4    Follow-up: Return in about 1  year (around 08/09/2022) for Annual.   Janyth Pupa, DO Attending [place], [place] for [facility], [place]

## 2021-08-10 LAB — TSH: TSH: 2.1 u[IU]/mL (ref 0.450–4.500)

## 2021-08-12 LAB — CYTOLOGY - PAP
Chlamydia: NEGATIVE
Comment: NEGATIVE
Comment: NEGATIVE
Comment: NORMAL
Diagnosis: NEGATIVE
High risk HPV: NEGATIVE
Neisseria Gonorrhea: NEGATIVE

## 2021-08-26 ENCOUNTER — Emergency Department (HOSPITAL_BASED_OUTPATIENT_CLINIC_OR_DEPARTMENT_OTHER): Payer: 59

## 2021-08-26 ENCOUNTER — Encounter (HOSPITAL_BASED_OUTPATIENT_CLINIC_OR_DEPARTMENT_OTHER): Payer: Self-pay | Admitting: Emergency Medicine

## 2021-08-26 ENCOUNTER — Ambulatory Visit
Admission: EM | Admit: 2021-08-26 | Discharge: 2021-08-26 | Disposition: A | Discharge status: Home / Self Care | Payer: 59 | Attending: Internal Medicine | Admitting: Internal Medicine

## 2021-08-26 ENCOUNTER — Emergency Department (HOSPITAL_BASED_OUTPATIENT_CLINIC_OR_DEPARTMENT_OTHER)
Admission: EM | Admit: 2021-08-26 | Discharge: 2021-08-26 | Disposition: A | Discharge status: Home / Self Care | Payer: 59 | Attending: Emergency Medicine | Admitting: Emergency Medicine

## 2021-08-26 ENCOUNTER — Other Ambulatory Visit: Payer: Self-pay

## 2021-08-26 DIAGNOSIS — Z85828 Personal history of other malignant neoplasm of skin: Secondary | ICD-10-CM | POA: Insufficient documentation

## 2021-08-26 DIAGNOSIS — R1010 Upper abdominal pain, unspecified: Secondary | ICD-10-CM | POA: Diagnosis not present

## 2021-08-26 DIAGNOSIS — R1011 Right upper quadrant pain: Secondary | ICD-10-CM | POA: Diagnosis not present

## 2021-08-26 DIAGNOSIS — R110 Nausea: Secondary | ICD-10-CM | POA: Diagnosis not present

## 2021-08-26 DIAGNOSIS — R109 Unspecified abdominal pain: Secondary | ICD-10-CM

## 2021-08-26 DIAGNOSIS — R52 Pain, unspecified: Secondary | ICD-10-CM

## 2021-08-26 DIAGNOSIS — J45909 Unspecified asthma, uncomplicated: Secondary | ICD-10-CM | POA: Diagnosis not present

## 2021-08-26 DIAGNOSIS — Z8616 Personal history of COVID-19: Secondary | ICD-10-CM | POA: Diagnosis not present

## 2021-08-26 DIAGNOSIS — R0789 Other chest pain: Secondary | ICD-10-CM

## 2021-08-26 DIAGNOSIS — R197 Diarrhea, unspecified: Secondary | ICD-10-CM | POA: Diagnosis not present

## 2021-08-26 DIAGNOSIS — Z91040 Latex allergy status: Secondary | ICD-10-CM | POA: Insufficient documentation

## 2021-08-26 LAB — COMPREHENSIVE METABOLIC PANEL
ALT: 12 U/L (ref 0–44)
AST: 14 U/L — ABNORMAL LOW (ref 15–41)
Albumin: 3.9 g/dL (ref 3.5–5.0)
Alkaline Phosphatase: 51 U/L (ref 38–126)
Anion gap: 9 (ref 5–15)
BUN: 11 mg/dL (ref 6–20)
CO2: 26 mmol/L (ref 22–32)
Calcium: 8.9 mg/dL (ref 8.9–10.3)
Chloride: 102 mmol/L (ref 98–111)
Creatinine, Ser: 0.76 mg/dL (ref 0.44–1.00)
GFR, Estimated: >60 mL/min (ref >60)
Glucose, Bld: 85 mg/dL (ref 70–99)
Potassium: 3.6 mmol/L (ref 3.5–5.1)
Sodium: 137 mmol/L (ref 135–145)
Total Bilirubin: 0.5 mg/dL (ref 0.3–1.2)
Total Protein: 7 g/dL (ref 6.5–8.1)

## 2021-08-26 LAB — CBC
HCT: 44.5 % (ref 36.0–46.0)
Hemoglobin: 15.3 g/dL — ABNORMAL HIGH (ref 12.0–15.0)
MCH: 30.7 pg (ref 26.0–34.0)
MCHC: 34.4 g/dL (ref 30.0–36.0)
MCV: 89.2 fL (ref 80.0–100.0)
Platelets: 204 10*3/uL (ref 150–400)
RBC: 4.99 MIL/uL (ref 3.87–5.11)
RDW: 12 % (ref 11.5–15.5)
WBC: 4.7 10*3/uL (ref 4.0–10.5)
nRBC: 0 % (ref 0.0–0.2)

## 2021-08-26 LAB — URINALYSIS, ROUTINE W REFLEX MICROSCOPIC
Bilirubin Urine: NEGATIVE
Glucose, UA: NEGATIVE mg/dL
Hgb urine dipstick: NEGATIVE
Ketones, ur: 15 mg/dL — AB
Leukocytes,Ua: NEGATIVE
Nitrite: NEGATIVE
Protein, ur: NEGATIVE mg/dL
Specific Gravity, Urine: 1.014 (ref 1.005–1.030)
pH: 6.5 (ref 5.0–8.0)

## 2021-08-26 LAB — PREGNANCY, URINE: Preg Test, Ur: NEGATIVE

## 2021-08-26 LAB — LIPASE, BLOOD: Lipase: 18 U/L (ref 11–51)

## 2021-08-26 MED ORDER — PANTOPRAZOLE SODIUM 20 MG PO TBEC: 20.0000 mg | DELAYED_RELEASE_TABLET | Freq: Every day | ORAL | 0 refills | Status: AC

## 2021-08-26 MED ORDER — HYDROCODONE-ACETAMINOPHEN 5-325 MG PO TABS: 1.0000 | ORAL_TABLET | ORAL | 0 refills | Status: DC | PRN

## 2021-08-26 MED ORDER — IOHEXOL 350 MG/ML SOLN
100.0000 mL | Freq: Once | INTRAVENOUS | Status: AC | PRN
  Administered 2021-08-26: 100 mL via INTRAVENOUS

## 2021-08-26 MED ORDER — SUCRALFATE 1 G PO TABS: 1.0000 g | ORAL_TABLET | Freq: Four times a day (QID) | ORAL | 0 refills | Status: DC

## 2021-08-26 MED ORDER — ONDANSETRON 8 MG PO TBDP: 8.0000 mg | ORAL_TABLET | Freq: Three times a day (TID) | ORAL | 0 refills | Status: DC | PRN

## 2021-08-26 MED ORDER — HYDROMORPHONE HCL 1 MG/ML IJ SOLN
0.5000 mg | Freq: Once | INTRAMUSCULAR | Status: AC
  Administered 2021-08-26: 0.5 mg via INTRAVENOUS
  Filled 2021-08-26: qty 1

## 2021-08-26 NOTE — ED Triage Notes (Signed)
Pt c/o URQ stabbing pain, bloating, acid reflux, belching, diarrhea, nausea. Pain is worst after eating but still feels pain between meals. States stool is "very light colored" like "clay." Onset last Thursday. States has had "stomach attacks" before but none this severe. Denies vomiting, blood in feces. States does not see a GI provider.

## 2021-08-26 NOTE — ED Provider Notes (Signed)
[facility]    CSN: [id number] Arrival date & time: 08/26/21  1311      History   Chief Complaint Chief Complaint  Patient presents with   Abdominal Pain    HPI Wanda Allen is a 35 y.o. female.   Patient presents with abdominal pain that has been present for a few weeks.  Patient states that she does have a history of "stomach attacks" but that stomach pain has worsened over the past few days and pain has become severe today.  Pain is mainly located in the epigastric and right upper quadrant area and described as "stabbing".  Patient states that pain is aggravated with food intake and movement.  Patient does have history of constipation but states that she has had diarrhea over the past couple days that is "clay colored".  Denies any nausea, vomiting, fever.  Patient has been evaluated by a provider previously for her "stomach attacks" and was prescribed reflux medication that has improved pains but she no longer takes this medication.  Patient states that she controls her symptoms with diet.  Patient is concerned as stomach pain has become more severe over the past few days.  Denies any blood in stool.  Patient states that she was not able to walk due to pain this morning.  Also states that pain radiates to right shoulder and back.   Abdominal Pain  Past Medical History:  Diagnosis Date   Abdominal pain    ADD (attention deficit disorder) 03/01/2011   ADD (attention deficit disorder)    Allergy    Asthma    Attention deficit disorder (ADD)    Atypical moles    Bronchitis    Cancer ([place])    melanoma R knee   Contact lens/glasses fitting 03/01/2011   COVID-19    Fatigue 03/01/2011   Loss of sleep/fatigue   Fatigue    Generalized headaches    May be due to allergies or medication. Patient is not sure.   Headache disorder 03/01/2011   Insomnia, unspecified 03/01/2011   Joint pain, elbow    Joint pain, elbow    JP drain bleeding    PCOS (polycystic ovarian  syndrome)    PONV (postoperative nausea and vomiting)    Sinus drainage 03/01/2011   Sinus problem    Skin moles 03/01/2011    Patient Active Problem List   Diagnosis Date Noted   Intrauterine pregnancy 08/08/2018   Abnormal Pap smear of cervix 07/10/2014   Abdominal mass, left lower quadrant, possible hernia 08/01/2011    Past Surgical History:  Procedure Laterality Date   CESAREAN SECTION N/A 08/08/2018   Procedure: CESAREAN SECTION;  Surgeon: Janyth Pupa, DO;  Location: [place];  Service: Obstetrics;  Laterality: N/A;   mass removal     mole     MUSCLE BIOPSY      OB History     Gravida  1   Para  1   Term  1   Preterm      AB      Living  1      SAB      IAB      Ectopic      Multiple  0   Live Births  1            Home Medications    Prior to Admission medications   Medication Sig Start Date End Date Taking? Authorizing Provider  albuterol (VENTOLIN HFA) 108 (90 Base) MCG/ACT inhaler  Inhale into the lungs every 6 (six) hours as needed for wheezing or shortness of breath. Patient not taking: Reported on 08/09/2021    [provider]  Amphetamine-Dextroamphetamine (ADDERALL PO) Take by mouth.    [provider]  etonogestrel-ethinyl estradiol (NUVARING) 0.12-0.015 MG/24HR vaginal ring Place 1 each vaginally every 28 (twenty-eight) days. Insert vaginally and leave in place for 3 consecutive weeks, then remove for 1 week. 08/09/21 11/07/21  Janyth Pupa, DO  Multiple Vitamins-Minerals (MULTIVITAMIN WITH MINERALS) tablet Take 1 tablet by mouth daily.    [provider]    Family History Family History  Problem Relation Age of Onset   Bladder Cancer Paternal Grandfather    Heart disease Paternal Grandmother    Cancer Maternal Grandmother    Lung cancer Maternal Grandfather    Diabetes Maternal Grandfather    Cancer Father    Other Mother        abnormal pap smears   Other Brother        tachycardia    Hypothyroidism Maternal Aunt    Breast cancer Other     Social History Social History   Tobacco Use   Smoking status: Never   Smokeless tobacco: Never  Vaping Use   Vaping Use: Never used  Substance Use Topics   Alcohol use: Not Currently   Drug use: No     Allergies   Effexor xr [venlafaxine hcl er], Latex, Sulfa antibiotics, and Sulphur [sulfur sublimed]   Review of Systems Review of Systems Per HPI  Physical Exam Triage Vital Signs ED Triage Vitals  Enc Vitals Group     BP 08/26/21 1438 (!) 143/95     Pulse Rate 08/26/21 1438 97     Resp 08/26/21 1438 18     Temp 08/26/21 1438 98.4 F (36.9 C)     Temp Source 08/26/21 1438 Oral     SpO2 08/26/21 1438 98 %     Weight --      Height --      Head Circumference --      Peak Flow --      Pain Score 08/26/21 1437 4     Pain Loc --      Pain Edu? --      Excl. in [place]? --    No data found.  Updated Vital Signs BP (!) 143/95 (BP Location: Left Arm)   Pulse 97   Temp 98.4 F (36.9 C) (Oral)   Resp 18   LMP 08/09/2021 (Approximate)   SpO2 98%   Visual Acuity Right Eye Distance:   Left Eye Distance:   Bilateral Distance:    Right Eye Near:   Left Eye Near:    Bilateral Near:     Physical Exam Constitutional:      Appearance: Normal appearance.  HENT:     Head: Normocephalic and atraumatic.  Eyes:     Extraocular Movements: Extraocular movements intact.     Conjunctiva/sclera: Conjunctivae normal.  Cardiovascular:     Rate and Rhythm: Normal rate and regular rhythm.     Pulses: Normal pulses.     Heart sounds: Normal heart sounds.  Pulmonary:     Effort: Pulmonary effort is normal.     Breath sounds: Normal breath sounds.  Abdominal:     General: Abdomen is protuberant. Bowel sounds are normal. There is no distension.     Palpations: Abdomen is soft.     Tenderness: There is abdominal tenderness in the right upper quadrant and  epigastric area.     Comments: Patient does have slight bloating to  abdomen.  Skin:    General: Skin is warm and dry.  Neurological:     General: No focal deficit present.     Mental Status: She is alert and oriented to person, place, and time. Mental status is at baseline.  Psychiatric:        Mood and Affect: Mood normal.        Behavior: Behavior normal.        Thought Content: Thought content normal.        Judgment: Judgment normal.     UC Treatments / Results  Labs (all labs ordered are listed, but only abnormal results are displayed) Labs Reviewed - No data to display  EKG   Radiology No results found.  Procedures Procedures (including critical care time)  Medications Ordered in UC Medications - No data to display  Initial Impression / Assessment and Plan / UC Course  I have reviewed the triage vital signs and the nursing notes.  Pertinent labs & imaging results that were available during my care of the patient were reviewed by me and considered in my medical decision making (see chart for details).     EKG completed to rule out any cardiac involvement.  EKG was unremarkable.  Highly suspicious that patient could have issues with gallbladder or liver that could be causing severe abdominal pain.  Advised patient to go to the hospital for further evaluation and management.  Patient was agreeable with plan.  Vital signs were stable at discharge, and EKG was normal so agree with patient's mother transporting her to the hospital. Final Clinical Impressions(s) / UC Diagnoses   Final diagnoses:  Right upper quadrant pain  Other chest pain     Discharge Instructions      Please go to the hospital as soon as you leave urgent care for further evaluation and management.     ED Prescriptions   None    PDMP not reviewed this encounter.   Odis Luster, FNP 08/26/21 [phone]

## 2021-08-26 NOTE — Discharge Instructions (Addendum)
Please go to the hospital as soon as you leave urgent care for further evaluation and management. 

## 2021-08-26 NOTE — ED Provider Notes (Signed)
[place] EMERGENCY DEPT Provider Note   CSN: [id number] Arrival date & time: 08/26/21  1622     History Chief Complaint  Patient presents with   Abdominal Pain    Wanda Allen is a 35 y.o. female.  35 year old female presents with upper quadrant abdominal pain.  Has had symptoms for several days.  Symptoms are worse after she eats.  Pain is in her upper quadrant and does radiate to her back.  No associated fever or chills.  Has had nausea but no vomiting.  Notes increased belching.  Slight diarrhea.  Went to urgent care and sent here for further management.      Past Medical History:  Diagnosis Date   Abdominal pain    ADD (attention deficit disorder) 03/01/2011   ADD (attention deficit disorder)    Allergy    Asthma    Attention deficit disorder (ADD)    Atypical moles    Bronchitis    Cancer ([place])    melanoma R knee   Contact lens/glasses fitting 03/01/2011   COVID-19    Fatigue 03/01/2011   Loss of sleep/fatigue   Fatigue    Generalized headaches    May be due to allergies or medication. Patient is not sure.   Headache disorder 03/01/2011   Insomnia, unspecified 03/01/2011   Joint pain, elbow    Joint pain, elbow    JP drain bleeding    PCOS (polycystic ovarian syndrome)    PONV (postoperative nausea and vomiting)    Sinus drainage 03/01/2011   Sinus problem    Skin moles 03/01/2011    Patient Active Problem List   Diagnosis Date Noted   Intrauterine pregnancy 08/08/2018   Abnormal Pap smear of cervix 07/10/2014   Abdominal mass, left lower quadrant, possible hernia 08/01/2011    Past Surgical History:  Procedure Laterality Date   CESAREAN SECTION N/A 08/08/2018   Procedure: CESAREAN SECTION;  Surgeon: Janyth Pupa, DO;  Location: [place];  Service: Obstetrics;  Laterality: N/A;   mass removal     mole     MUSCLE BIOPSY       OB History     Gravida  1   Para  1   Term  1   Preterm      AB      Living  1       SAB      IAB      Ectopic      Multiple  0   Live Births  1           Family History  Problem Relation Age of Onset   Bladder Cancer Paternal Grandfather    Heart disease Paternal Grandmother    Cancer Maternal Grandmother    Lung cancer Maternal Grandfather    Diabetes Maternal Grandfather    Cancer Father    Other Mother        abnormal pap smears   Other Brother        tachycardia   Hypothyroidism Maternal Aunt    Breast cancer Other     Social History   Tobacco Use   Smoking status: Never   Smokeless tobacco: Never  Vaping Use   Vaping Use: Never used  Substance Use Topics   Alcohol use: Not Currently   Drug use: No    Home Medications Prior to Admission medications   Medication Sig Start Date End Date Taking? Authorizing Provider  albuterol (VENTOLIN HFA) 108 (90 Base) MCG/ACT  inhaler Inhale into the lungs every 6 (six) hours as needed for wheezing or shortness of breath. Patient not taking: Reported on 08/09/2021    [provider]  Amphetamine-Dextroamphetamine (ADDERALL PO) Take by mouth.    [provider]  etonogestrel-ethinyl estradiol (NUVARING) 0.12-0.015 MG/24HR vaginal ring Place 1 each vaginally every 28 (twenty-eight) days. Insert vaginally and leave in place for 3 consecutive weeks, then remove for 1 week. 08/09/21 11/07/21  Janyth Pupa, DO  Multiple Vitamins-Minerals (MULTIVITAMIN WITH MINERALS) tablet Take 1 tablet by mouth daily.    [provider]    Allergies    Effexor xr [venlafaxine hcl er], Latex, Sulfa antibiotics, and Sulphur [sulfur sublimed]  Review of Systems   Review of Systems  All other systems reviewed and are negative.  Physical Exam Updated Vital Signs BP (!) 125/93 (BP Location: Left Arm)   Pulse 95   Temp 98.7 F (37.1 C)   Resp 18   Ht 1.676 m ('5\' 6"'$ )   Wt 66.2 kg   LMP 08/09/2021 (Approximate)   SpO2 98%   BMI 23.57 kg/m   Physical Exam Vitals and nursing note reviewed.   Constitutional:      General: She is not in acute distress.    Appearance: Normal appearance. She is well-developed. She is not toxic-appearing.  HENT:     Head: Normocephalic and atraumatic.  Eyes:     General: Lids are normal.     Conjunctiva/sclera: Conjunctivae normal.     Pupils: Pupils are equal, round, and reactive to light.  Neck:     Thyroid: No thyroid mass.     Trachea: No tracheal deviation.  Cardiovascular:     Rate and Rhythm: Normal rate and regular rhythm.     Heart sounds: Normal heart sounds. No murmur heard.   No gallop.  Pulmonary:     Effort: Pulmonary effort is normal. No respiratory distress.     Breath sounds: Normal breath sounds. No stridor. No decreased breath sounds, wheezing, rhonchi or rales.  Abdominal:     General: There is no distension.     Palpations: Abdomen is soft.     Tenderness: There is abdominal tenderness in the right upper quadrant and epigastric area. There is guarding. There is no rebound.  Musculoskeletal:        General: No tenderness. Normal range of motion.     Cervical back: Normal range of motion and neck supple.  Skin:    General: Skin is warm and dry.     Findings: No abrasion or rash.  Neurological:     Mental Status: She is alert and oriented to person, place, and time. Mental status is at baseline.     GCS: GCS eye subscore is 4. GCS verbal subscore is 5. GCS motor subscore is 6.     Cranial Nerves: Cranial nerves are intact. No cranial nerve deficit.     Sensory: No sensory deficit.     Motor: Motor function is intact.  Psychiatric:        Attention and Perception: Attention normal.        Speech: Speech normal.        Behavior: Behavior normal.    ED Results / Procedures / Treatments   Labs (all labs ordered are listed, but only abnormal results are displayed) Labs Reviewed  CBC - Abnormal; Notable for the following components:      Result Value   Hemoglobin 15.3 (*)    All other components within normal  limits  URINALYSIS, ROUTINE W REFLEX MICROSCOPIC - Abnormal; Notable for the following components:   Ketones, ur 15 (*)    All other components within normal limits  PREGNANCY, URINE  LIPASE, BLOOD  COMPREHENSIVE METABOLIC PANEL    EKG None  Radiology No results found.  Procedures Procedures   Medications Ordered in ED Medications - No data to display  ED Course  I have reviewed the triage vital signs and the nursing notes.  Pertinent labs & imaging results that were available during my care of the patient were reviewed by me and considered in my medical decision making (see chart for details).    MDM Rules/Calculators/A&P                           Treat with IV fluids and pain medication here.  Labs are reassuring.  Abdominal ultrasound negative for gallbladder pathology.  Subsequent abdominal CT performed and did not show any acute process.  This report was read to me by the radiology tech as the full report cannot be pulled forward.  Patient states that she has been having these issues for quite some time.  Will give GI referral.  We will place on short course of analgesics. Final Clinical Impression(s) / ED Diagnoses Final diagnoses:  Pain aggravated by activities of daily living    Rx / DC Orders ED Discharge Orders     None        Lacretia Leigh, MD 08/26/21 2218

## 2021-08-26 NOTE — ED Triage Notes (Signed)
Rt sided abd pain with burping a lot and has had diarrhea

## 2021-08-31 ENCOUNTER — Other Ambulatory Visit (HOSPITAL_COMMUNITY): Payer: Self-pay | Admitting: Gastroenterology

## 2021-08-31 ENCOUNTER — Other Ambulatory Visit: Payer: Self-pay | Admitting: Gastroenterology

## 2021-08-31 DIAGNOSIS — R1011 Right upper quadrant pain: Secondary | ICD-10-CM

## 2021-09-13 ENCOUNTER — Other Ambulatory Visit: Payer: Self-pay

## 2021-09-13 ENCOUNTER — Encounter (HOSPITAL_COMMUNITY)
Admission: RE | Admit: 2021-09-13 | Discharge: 2021-09-13 | Disposition: A | Discharge status: Home / Self Care | Payer: 59 | Source: Ambulatory Visit | Attending: Gastroenterology | Admitting: Gastroenterology

## 2021-09-13 DIAGNOSIS — R1011 Right upper quadrant pain: Secondary | ICD-10-CM | POA: Diagnosis not present

## 2021-09-13 MED ORDER — TECHNETIUM TC 99M MEBROFENIN IV KIT
5.1000 | PACK | Freq: Once | INTRAVENOUS | Status: AC | PRN
  Administered 2021-09-13: 5.1 via INTRAVENOUS

## 2021-10-12 ENCOUNTER — Telehealth: Payer: Self-pay | Admitting: Internal Medicine

## 2021-10-12 NOTE — Telephone Encounter (Signed)
Scheduled appt per 10/18 referral. Pt is aware of appt date and time.

## 2021-10-22 ENCOUNTER — Other Ambulatory Visit: Payer: Self-pay | Admitting: Physician Assistant

## 2021-10-22 DIAGNOSIS — D751 Secondary polycythemia: Secondary | ICD-10-CM

## 2021-10-25 ENCOUNTER — Other Ambulatory Visit: Payer: Self-pay | Admitting: Internal Medicine

## 2021-10-25 ENCOUNTER — Inpatient Hospital Stay (HOSPITAL_BASED_OUTPATIENT_CLINIC_OR_DEPARTMENT_OTHER): Payer: 59 | Admitting: Internal Medicine

## 2021-10-25 ENCOUNTER — Other Ambulatory Visit: Payer: Self-pay

## 2021-10-25 ENCOUNTER — Encounter: Payer: Self-pay | Admitting: Internal Medicine

## 2021-10-25 ENCOUNTER — Inpatient Hospital Stay: Payer: 59 | Attending: Internal Medicine

## 2021-10-25 DIAGNOSIS — Z85820 Personal history of malignant melanoma of skin: Secondary | ICD-10-CM | POA: Insufficient documentation

## 2021-10-25 DIAGNOSIS — Z8052 Family history of malignant neoplasm of bladder: Secondary | ICD-10-CM | POA: Insufficient documentation

## 2021-10-25 DIAGNOSIS — Z801 Family history of malignant neoplasm of trachea, bronchus and lung: Secondary | ICD-10-CM | POA: Insufficient documentation

## 2021-10-25 DIAGNOSIS — R109 Unspecified abdominal pain: Secondary | ICD-10-CM | POA: Insufficient documentation

## 2021-10-25 DIAGNOSIS — E282 Polycystic ovarian syndrome: Secondary | ICD-10-CM | POA: Insufficient documentation

## 2021-10-25 DIAGNOSIS — Z8249 Family history of ischemic heart disease and other diseases of the circulatory system: Secondary | ICD-10-CM | POA: Insufficient documentation

## 2021-10-25 DIAGNOSIS — Z833 Family history of diabetes mellitus: Secondary | ICD-10-CM | POA: Diagnosis not present

## 2021-10-25 DIAGNOSIS — R5383 Other fatigue: Secondary | ICD-10-CM

## 2021-10-25 DIAGNOSIS — Z8349 Family history of other endocrine, nutritional and metabolic diseases: Secondary | ICD-10-CM

## 2021-10-25 DIAGNOSIS — Z793 Long term (current) use of hormonal contraceptives: Secondary | ICD-10-CM

## 2021-10-25 DIAGNOSIS — D751 Secondary polycythemia: Secondary | ICD-10-CM

## 2021-10-25 DIAGNOSIS — J45909 Unspecified asthma, uncomplicated: Secondary | ICD-10-CM | POA: Insufficient documentation

## 2021-10-25 DIAGNOSIS — Z803 Family history of malignant neoplasm of breast: Secondary | ICD-10-CM | POA: Insufficient documentation

## 2021-10-25 DIAGNOSIS — M459 Ankylosing spondylitis of unspecified sites in spine: Secondary | ICD-10-CM

## 2021-10-25 DIAGNOSIS — R519 Headache, unspecified: Secondary | ICD-10-CM | POA: Diagnosis not present

## 2021-10-25 LAB — CBC WITH DIFFERENTIAL (CANCER CENTER ONLY)
Abs Immature Granulocytes: 0.01 10*3/uL (ref 0.00–0.07)
Basophils Absolute: 0 10*3/uL (ref 0.0–0.1)
Basophils Relative: 1 %
Eosinophils Absolute: 0 10*3/uL (ref 0.0–0.5)
Eosinophils Relative: 0 %
HCT: 41.7 % (ref 36.0–46.0)
Hemoglobin: 14.6 g/dL (ref 12.0–15.0)
Immature Granulocytes: 0 %
Lymphocytes Relative: 46 %
Lymphs Abs: 2.2 10*3/uL (ref 0.7–4.0)
MCH: 30.5 pg (ref 26.0–34.0)
MCHC: 35 g/dL (ref 30.0–36.0)
MCV: 87.1 fL (ref 80.0–100.0)
Monocytes Absolute: 0.3 10*3/uL (ref 0.1–1.0)
Monocytes Relative: 5 %
Neutro Abs: 2.3 10*3/uL (ref 1.7–7.7)
Neutrophils Relative %: 48 %
Platelet Count: 244 10*3/uL (ref 150–400)
RBC: 4.79 MIL/uL (ref 3.87–5.11)
RDW: 11.9 % (ref 11.5–15.5)
WBC Count: 4.8 10*3/uL (ref 4.0–10.5)
nRBC: 0 % (ref 0.0–0.2)

## 2021-10-25 LAB — LACTATE DEHYDROGENASE: LDH: 131 U/L (ref 98–192)

## 2021-10-25 LAB — CMP (CANCER CENTER ONLY)
ALT: 12 U/L (ref 0–44)
AST: 13 U/L — ABNORMAL LOW (ref 15–41)
Albumin: 3.8 g/dL (ref 3.5–5.0)
Alkaline Phosphatase: 51 U/L (ref 38–126)
Anion gap: 9 (ref 5–15)
BUN: 13 mg/dL (ref 6–20)
CO2: 26 mmol/L (ref 22–32)
Calcium: 9.2 mg/dL (ref 8.9–10.3)
Chloride: 106 mmol/L (ref 98–111)
Creatinine: 0.74 mg/dL (ref 0.44–1.00)
GFR, Estimated: >60 mL/min (ref >60)
Glucose, Bld: 86 mg/dL (ref 70–99)
Potassium: 3.9 mmol/L (ref 3.5–5.1)
Sodium: 141 mmol/L (ref 135–145)
Total Bilirubin: 0.5 mg/dL (ref 0.3–1.2)
Total Protein: 7.2 g/dL (ref 6.5–8.1)

## 2021-10-25 LAB — IRON AND TIBC
Iron: 143 ug/dL — ABNORMAL HIGH (ref 41–142)
Saturation Ratios: 38 % (ref 21–57)
TIBC: 373 ug/dL (ref 236–444)
UIBC: 230 ug/dL (ref 120–384)

## 2021-10-25 LAB — FERRITIN: Ferritin: 29 ng/mL (ref 11–307)

## 2021-10-25 NOTE — Progress Notes (Signed)
[facility] [phone]   [phone]  CONSULT NOTE  REFERRING PHYSICIAN: Dr. Janyth Pupa  REASON FOR CONSULTATION:  35 years old white female with suspicious polycythemia  HPI Wanda Allen is a 35 y.o. female with past medical history significant for ADD, intermittent abdominal pain, atypical moles, bronchitis, history of early stage melanoma s/p resection from the right knee, ankylosing spondylitis, asthma, headache disorder, insomnia, polycystic ovarian syndrome, sinus drainage.  The patient also has a history of chronic fatigue that has been going on for long time and has been getting worse recently.  She was seen by her OB/GYN complaining of the fatigue and TSH performed at that time was normal.  She also started having hair loss and stomach pain.  She was seen by gastroenterology, Dr. Collene Mares.  Her pain was on the right upper quadrant and she had suspicious cholecystitis.  Ultrasound of the abdomen on 08/26/2021 showed positive sonographic Murphy signs in the absence of cholelithiasis or acute cholecystitis.  CT of the abdomen and pelvis on the same day showed no acute or active process within the abdomen or pelvis.  A HIDA scan performed on 09/13/2021 showed normal uptake and excretion of biliary tracer and normal gallbladder ejection fraction.  The patient had a history of anemia and she was advised to increase her iron rich diet few months ago.  She had repeat CBC on 08/26/2021 and it showed elevated hemoglobin of 15.3 and hematocrit 44.5%.  She had normal white blood count as well as platelets count.  She has a family history of P vera and she was referred to me today for evaluation and to rule out any underlying hematologic disorder. When seen today she continues to complain of the chronic fatigue as well as worsening headache and blurry vision.  She also has dry skin and falling hair.  She denied having any chest pain, shortness of breath, cough or  hemoptysis.  She has intermittent nausea with no vomiting, diarrhea or constipation.  She also used to have regular periods but not recently. Family history significant for father with heart disease.  Mother is healthy.  Paternal grandfather had bladder cancer.  Maternal grand mother had splenic neoplasm and maternal grandfather had lung cancer.  There is also a strong family history of autoimmune disorder with Hashimoto's thyroiditis as well as hypothyroidism. The patient is married and has 1 child age 5.  She works as a Theme park manager.  She has no history for smoking, alcohol or drug abuse.   HPI  Past Medical History:  Diagnosis Date   Abdominal pain    ADD (attention deficit disorder) 03/01/2011   ADD (attention deficit disorder)    Allergy    Asthma    Attention deficit disorder (ADD)    Atypical moles    Bronchitis    Cancer ([place])    melanoma R knee   Contact lens/glasses fitting 03/01/2011   COVID-19    Fatigue 03/01/2011   Loss of sleep/fatigue   Fatigue    Generalized headaches    May be due to allergies or medication. Patient is not sure.   Headache disorder 03/01/2011   Insomnia, unspecified 03/01/2011   Joint pain, elbow    Joint pain, elbow    JP drain bleeding    PCOS (polycystic ovarian syndrome)    PONV (postoperative nausea and vomiting)    Sinus drainage 03/01/2011   Sinus problem    Skin moles 03/01/2011    Past Surgical History:  Procedure Laterality Date   CESAREAN SECTION N/A 08/08/2018   Procedure: CESAREAN SECTION;  Surgeon: Janyth Pupa, DO;  Location: [facility];  Service: Obstetrics;  Laterality: N/A;   mass removal     mole     MUSCLE BIOPSY      Family History  Problem Relation Age of Onset   Bladder Cancer Paternal Grandfather    Heart disease Paternal Grandmother    Cancer Maternal Grandmother    Lung cancer Maternal Grandfather    Diabetes Maternal Grandfather    Cancer Father    Other Mother        abnormal pap smears   Other Brother         tachycardia   Hypothyroidism Maternal Aunt    Breast cancer Other     Social History Social History   Tobacco Use   Smoking status: Never   Smokeless tobacco: Never  Vaping Use   Vaping Use: Never used  Substance Use Topics   Alcohol use: Not Currently   Drug use: No    Allergies  Allergen Reactions   Effexor Xr [Venlafaxine Hcl Er] Other (See Comments)    Unknown, possibly rash    Latex Hives and Rash    Were touched.   Sulfa Antibiotics Rash   Sulphur [Sulfur Sublimed] Rash    All over body.    Current Outpatient Medications  Medication Sig Dispense Refill   albuterol (VENTOLIN HFA) 108 (90 Base) MCG/ACT inhaler Inhale into the lungs every 6 (six) hours as needed for wheezing or shortness of breath. (Patient not taking: Reported on 08/09/2021)     Amphetamine-Dextroamphetamine (ADDERALL PO) Take by mouth.     etonogestrel-ethinyl estradiol (NUVARING) 0.12-0.015 MG/24HR vaginal ring Place 1 each vaginally every 28 (twenty-eight) days. Insert vaginally and leave in place for 3 consecutive weeks, then remove for 1 week. 3 each 4   HYDROcodone-acetaminophen (NORCO/VICODIN) 5-325 MG tablet Take 1-2 tablets by mouth every 4 (four) hours as needed. 10 tablet 0   Multiple Vitamins-Minerals (MULTIVITAMIN WITH MINERALS) tablet Take 1 tablet by mouth daily.     ondansetron (ZOFRAN ODT) 8 MG disintegrating tablet Take 1 tablet (8 mg total) by mouth every 8 (eight) hours as needed for nausea or vomiting. 20 tablet 0   pantoprazole (PROTONIX) 20 MG tablet Take 1 tablet (20 mg total) by mouth daily. 30 tablet 0   sucralfate (CARAFATE) 1 g tablet Take 1 tablet (1 g total) by mouth 4 (four) times daily. 30 tablet 0   No current facility-administered medications for this visit.    Review of Systems  Constitutional: positive for fatigue Eyes: negative Ears, nose, mouth, throat, and face: negative Respiratory: negative Cardiovascular: negative Gastrointestinal: positive for  abdominal pain, nausea, and reflux symptoms Genitourinary:negative Integument/breast: negative Hematologic/lymphatic: negative Musculoskeletal:positive for arthralgias Neurological: negative Behavioral/Psych: positive for sleep disturbance Endocrine: negative Allergic/Immunologic: negative  Physical Exam  KDX:IPJAS, healthy, no distress, well nourished, well developed, and anxious SKIN: skin color, texture, turgor are normal, no rashes or significant lesions HEAD: Normocephalic, No masses, lesions, tenderness or abnormalities EYES: normal, PERRLA, Conjunctiva are pink and non-injected EARS: External ears normal, Canals clear OROPHARYNX:no exudate, no erythema, and lips, buccal mucosa, and tongue normal  NECK: supple, no adenopathy, no JVD LYMPH:  no palpable lymphadenopathy, no hepatosplenomegaly BREAST:not examined LUNGS: clear to auscultation , and palpation HEART: regular rate & rhythm, no murmurs, and no gallops ABDOMEN:abdomen soft, non-tender, normal bowel sounds, and no masses or organomegaly BACK: Back symmetric, no curvature.,  No CVA tenderness EXTREMITIES:no joint deformities, effusion, or inflammation, no edema  NEURO: alert & oriented x 3 with fluent speech, no focal motor/sensory deficits  PERFORMANCE STATUS: ECOG 1  LABORATORY DATA: Lab Results  Component Value Date   WBC 4.8 10/25/2021   HGB 14.6 10/25/2021   HCT 41.7 10/25/2021   MCV 87.1 10/25/2021   PLT 244 10/25/2021      Chemistry      Component Value Date/Time   NA 141 10/25/2021 1122   K 3.9 10/25/2021 1122   CL 106 10/25/2021 1122   CO2 26 10/25/2021 1122   BUN 13 10/25/2021 1122   CREATININE 0.74 10/25/2021 1122      Component Value Date/Time   CALCIUM 9.2 10/25/2021 1122   ALKPHOS 51 10/25/2021 1122   AST 13 (L) 10/25/2021 1122   ALT 12 10/25/2021 1122   BILITOT 0.5 10/25/2021 1122       RADIOGRAPHIC STUDIES: No results found.  ASSESSMENT: This is a very pleasant 35 years old  white female who presented for evaluation of polycythemia with a family history of polycythemia vera as well as autoimmune disorders and several family members with malignancy.   PLAN: I had a lengthy discussion with the patient and her mother today about her current condition and further investigation to rule out any underlying myeloproliferative disorder. Repeat CBC today showed normal hemoglobin of 14.6 and hematocrit 41.7%.  Her comprehensive metabolic panel is unremarkable.  She has normal ferritin level of 29.  Serum iron was elevated at 143 with iron saturation of 38%.  Other lab work including erythropoietin level, hemochromatosis DNA as well as JAK2 mutations are still pending. I assured the patient that she is unlikely to have any underlying myeloproliferative disorder but we will wait for the molecular studies to rule out this possibility especially with her family history.Marland Kitchen She was advised to follow-up with her gastroenterologist as well as rheumatologist because of the significant autoimmune disorder in her family. I will see the patient on as-needed basis at this point.   She was advised to call if she has any concerning symptoms.  The patient voices understanding of current disease status and treatment options and is in agreement with the current care plan.  All questions were answered. The patient knows to call the clinic with any problems, questions or concerns. We can certainly see the patient much sooner if necessary.  Thank you so much for allowing me to participate in the care of Wanda Allen. I will continue to follow up the patient with you and assist in her care.  The total time spent in the appointment was 60 minutes.  Disclaimer: This note was dictated with voice recognition software. Similar sounding words can inadvertently be transcribed and may not be corrected upon review.   Eilleen Kempf October 25, 2021, 12:20 PM

## 2021-10-26 LAB — ERYTHROPOIETIN: Erythropoietin: 11.9 m[IU]/mL (ref 2.6–18.5)

## 2021-11-01 LAB — HEMOCHROMATOSIS DNA-PCR(C282Y,H63D)

## 2021-11-01 LAB — JAK2 (INCLUDING V617F AND EXON 12), MPL,& CALR-NEXT GEN SEQ

## 2021-12-03 ENCOUNTER — Other Ambulatory Visit: Payer: Self-pay

## 2021-12-03 ENCOUNTER — Ambulatory Visit
Admission: EM | Admit: 2021-12-03 | Discharge: 2021-12-03 | Disposition: A | Discharge status: Home / Self Care | Payer: 59 | Attending: Physician Assistant | Admitting: Physician Assistant

## 2021-12-03 DIAGNOSIS — J0110 Acute frontal sinusitis, unspecified: Secondary | ICD-10-CM | POA: Diagnosis not present

## 2021-12-03 MED ORDER — DOXYCYCLINE HYCLATE 100 MG PO CAPS: 100.0000 mg | ORAL_CAPSULE | Freq: Two times a day (BID) | ORAL | 0 refills | Status: AC

## 2021-12-03 NOTE — ED Provider Notes (Signed)
[facility]    CSN: [id number] Arrival date & time: 12/03/21  1011      History   Chief Complaint Chief Complaint  Patient presents with   Nasal Congestion   Hoarse    HPI Wanda Allen is a 35 y.o. female.   Patient here today for evaluation of recurrent nasal congestion that is been ongoing for the last several weeks.  She reports that symptoms have waxed and waned since onset in late November.  Denies any fever.  She reports she has started to become more hoarse over the last few days.  She has tried over-the-counter medication and her allergy medication without significant relief.  The history is provided by the patient.   Past Medical History:  Diagnosis Date   Abdominal pain    ADD (attention deficit disorder) 03/01/2011   ADD (attention deficit disorder)    Allergy    Asthma    Attention deficit disorder (ADD)    Atypical moles    Bronchitis    Cancer ([place])    melanoma R knee   Contact lens/glasses fitting 03/01/2011   COVID-19    Fatigue 03/01/2011   Loss of sleep/fatigue   Fatigue    Generalized headaches    May be due to allergies or medication. Patient is not sure.   Headache disorder 03/01/2011   Insomnia, unspecified 03/01/2011   Joint pain, elbow    Joint pain, elbow    JP drain bleeding    PCOS (polycystic ovarian syndrome)    PONV (postoperative nausea and vomiting)    Sinus drainage 03/01/2011   Sinus problem    Skin moles 03/01/2011    Patient Active Problem List   Diagnosis Date Noted   Polycythemia, secondary 10/25/2021   Intrauterine pregnancy 08/08/2018   Abnormal Pap smear of cervix 07/10/2014   Abdominal mass, left lower quadrant, possible hernia 08/01/2011    Past Surgical History:  Procedure Laterality Date   CESAREAN SECTION N/A 08/08/2018   Procedure: CESAREAN SECTION;  Surgeon: Janyth Pupa, DO;  Location: [facility];  Service: Obstetrics;  Laterality: N/A;   mass removal     mole     MUSCLE BIOPSY       OB History     Gravida  1   Para  1   Term  1   Preterm      AB      Living  1      SAB      IAB      Ectopic      Multiple  0   Live Births  1            Home Medications    Prior to Admission medications   Medication Sig Start Date End Date Taking? Authorizing Provider  doxycycline (VIBRAMYCIN) 100 MG capsule Take 1 capsule (100 mg total) by mouth 2 (two) times daily for 7 days. 12/03/21 12/10/21 Yes Francene Finders, PA-C  albuterol (VENTOLIN HFA) 108 (90 Base) MCG/ACT inhaler Inhale into the lungs every 6 (six) hours as needed for wheezing or shortness of breath. Patient not taking: Reported on 08/09/2021    [provider]  Amphetamine-Dextroamphetamine (ADDERALL PO) Take by mouth.    [provider]  etonogestrel-ethinyl estradiol (NUVARING) 0.12-0.015 MG/24HR vaginal ring Place 1 each vaginally every 28 (twenty-eight) days. Insert vaginally and leave in place for 3 consecutive weeks, then remove for 1 week. 08/09/21 11/07/21  Janyth Pupa, DO  HYDROcodone-acetaminophen (NORCO/VICODIN) [phone]  MG tablet Take 1-2 tablets by mouth every 4 (four) hours as needed. 08/26/21   Lacretia Leigh, MD  Multiple Vitamins-Minerals (MULTIVITAMIN WITH MINERALS) tablet Take 1 tablet by mouth daily.    [provider]  ondansetron (ZOFRAN ODT) 8 MG disintegrating tablet Take 1 tablet (8 mg total) by mouth every 8 (eight) hours as needed for nausea or vomiting. 08/26/21   Lacretia Leigh, MD  pantoprazole (PROTONIX) 20 MG tablet Take 1 tablet (20 mg total) by mouth daily. 08/26/21   Lacretia Leigh, MD  sucralfate (CARAFATE) 1 g tablet Take 1 tablet (1 g total) by mouth 4 (four) times daily. 08/26/21   Lacretia Leigh, MD    Family History Family History  Problem Relation Age of Onset   Bladder Cancer Paternal Grandfather    Heart disease Paternal Grandmother    Cancer Maternal Grandmother    Lung cancer Maternal Grandfather    Diabetes Maternal  Grandfather    Cancer Father    Other Mother        abnormal pap smears   Other Brother        tachycardia   Hypothyroidism Maternal Aunt    Breast cancer Other     Social History Social History   Tobacco Use   Smoking status: Never   Smokeless tobacco: Never  Vaping Use   Vaping Use: Never used  Substance Use Topics   Alcohol use: Not Currently   Drug use: No     Allergies   Effexor xr [venlafaxine hcl er], Latex, Sulfa antibiotics, and Sulphur [sulfur sublimed]   Review of Systems Review of Systems  Constitutional:  Negative for chills and fever.  HENT:  Positive for congestion, sinus pressure (frontal), sore throat and voice change. Negative for ear pain.   Eyes:  Negative for discharge and redness.  Respiratory:  Positive for cough. Negative for shortness of breath and wheezing.   Gastrointestinal:  Negative for abdominal pain, diarrhea, nausea and vomiting.    Physical Exam Triage Vital Signs ED Triage Vitals  Enc Vitals Group     BP      Pulse      Resp      Temp      Temp src      SpO2      Weight      Height      Head Circumference      Peak Flow      Pain Score      Pain Loc      Pain Edu?      Excl. in [place]?    No data found.  Updated Vital Signs BP 132/81 (BP Location: Right Arm)   Pulse 86   Temp 98.2 F (36.8 C) (Oral)   Resp 18   SpO2 100%     Physical Exam Vitals and nursing note reviewed.  Constitutional:      General: She is not in acute distress.    Appearance: Normal appearance. She is not ill-appearing.  HENT:     Head: Normocephalic and atraumatic.     Right Ear: Tympanic membrane normal.     Left Ear: Tympanic membrane normal.     Nose: Congestion present.     Mouth/Throat:     Mouth: Mucous membranes are moist.     Pharynx: No oropharyngeal exudate or posterior oropharyngeal erythema.  Eyes:     Conjunctiva/sclera: Conjunctivae normal.  Cardiovascular:     Rate and Rhythm: Normal rate and regular rhythm.  Heart sounds: Normal heart sounds. No murmur heard. Pulmonary:     Effort: Pulmonary effort is normal. No respiratory distress.     Breath sounds: Normal breath sounds. No wheezing, rhonchi or rales.  Skin:    General: Skin is warm and dry.  Neurological:     Mental Status: She is alert.  Psychiatric:        Mood and Affect: Mood normal.        Thought Content: Thought content normal.     UC Treatments / Results  Labs (all labs ordered are listed, but only abnormal results are displayed) Labs Reviewed - No data to display  EKG   Radiology No results found.  Procedures Procedures (including critical care time)  Medications Ordered in UC Medications - No data to display  Initial Impression / Assessment and Plan / UC Course  I have reviewed the triage vital signs and the nursing notes.  Pertinent labs & imaging results that were available during my care of the patient were reviewed by me and considered in my medical decision making (see chart for details).    Suspect likely sinusitis given duration of symptoms and quality of waxing and waning.  Recommended follow-up if symptoms fail to improve with antibiotic therapy or sooner with any further concerns.  She does report that typically antibiotics will produce yeast infections and I encouraged her to call for prescription for diflucan should this occur.  Final Clinical Impressions(s) / UC Diagnoses   Final diagnoses:  Acute frontal sinusitis, recurrence not specified   Discharge Instructions   None    ED Prescriptions     Medication Sig Dispense Auth. Provider   doxycycline (VIBRAMYCIN) 100 MG capsule Take 1 capsule (100 mg total) by mouth 2 (two) times daily for 7 days. 14 capsule Francene Finders, PA-C      PDMP not reviewed this encounter.   Francene Finders, PA-C 12/03/21 1133

## 2021-12-03 NOTE — ED Triage Notes (Signed)
Greater than 1 week h/o congestion, intermittent HA, runny nose and cough with onset this week of hoarseness. Pt also c/o her ears being clogged Has been taking otc meds with a decrease in sxs. No v/d.

## 2022-02-02 ENCOUNTER — Other Ambulatory Visit: Payer: Self-pay | Admitting: Family Medicine

## 2022-02-02 DIAGNOSIS — N6310 Unspecified lump in the right breast, unspecified quadrant: Secondary | ICD-10-CM

## 2022-03-02 ENCOUNTER — Ambulatory Visit
Admission: RE | Admit: 2022-03-02 | Discharge: 2022-03-02 | Disposition: A | Discharge status: Home / Self Care | Payer: 59 | Source: Ambulatory Visit | Attending: Family Medicine | Admitting: Family Medicine

## 2022-03-02 DIAGNOSIS — N6310 Unspecified lump in the right breast, unspecified quadrant: Secondary | ICD-10-CM

## 2022-07-11 IMAGING — MG DIGITAL DIAGNOSTIC BILAT W/ TOMO W/ CAD
6 of 10 series · 6 of 30 positions shown · non-contrast
Comparison: None.

CLINICAL DATA: 35-year-old female with palpable thickening in the
RETROAREOLAR RIGHT breast discovered on self-examination. Baseline
mammogram.

EXAM:
DIGITAL DIAGNOSTIC BILATERAL MAMMOGRAM WITH TOMOSYNTHESIS AND CAD;
ULTRASOUND RIGHT BREAST LIMITED
TECHNIQUE: Bilateral digital diagnostic mammography and breast tomosynthesis
was performed. The images were evaluated with computer-aided
detection.; Targeted ultrasound examination of the right breast was
performed

[L MLO synth-2D]
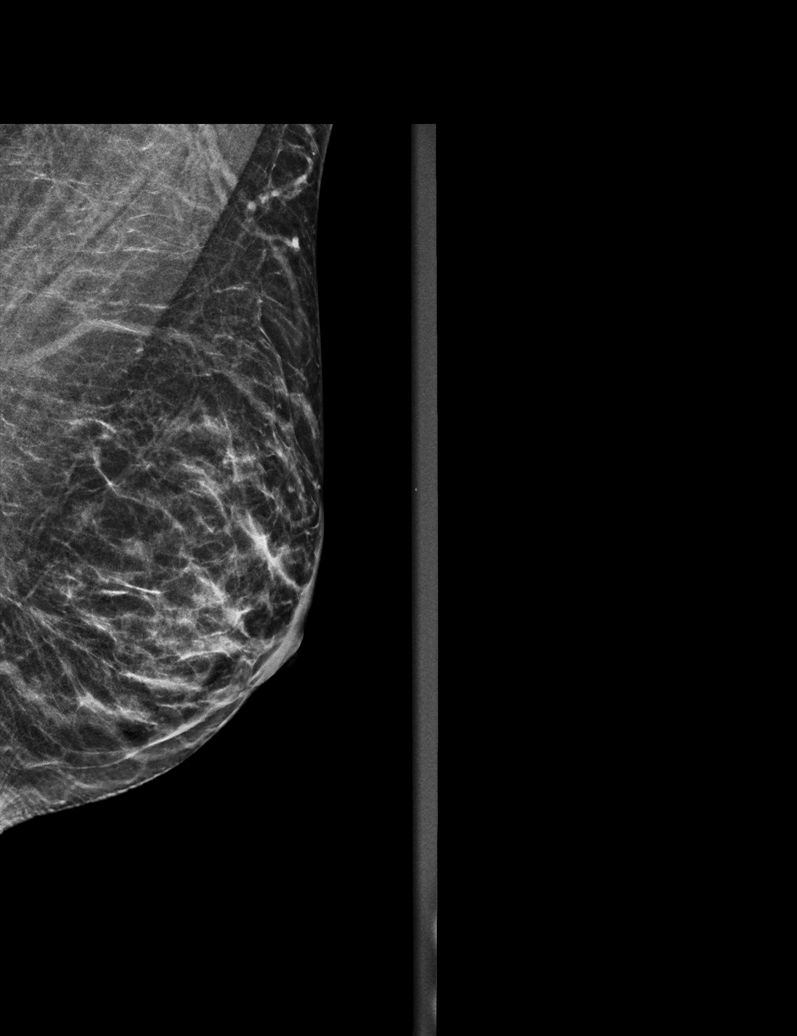

[L CC synth-2D]
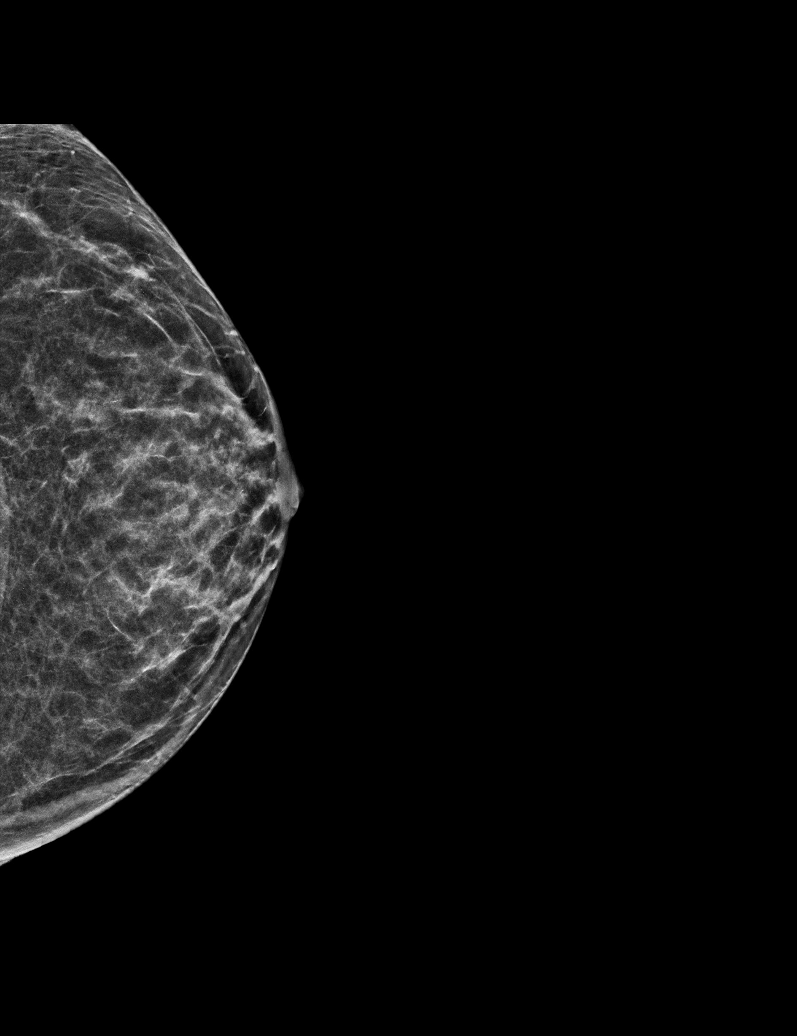

[R TAN synth-2D]
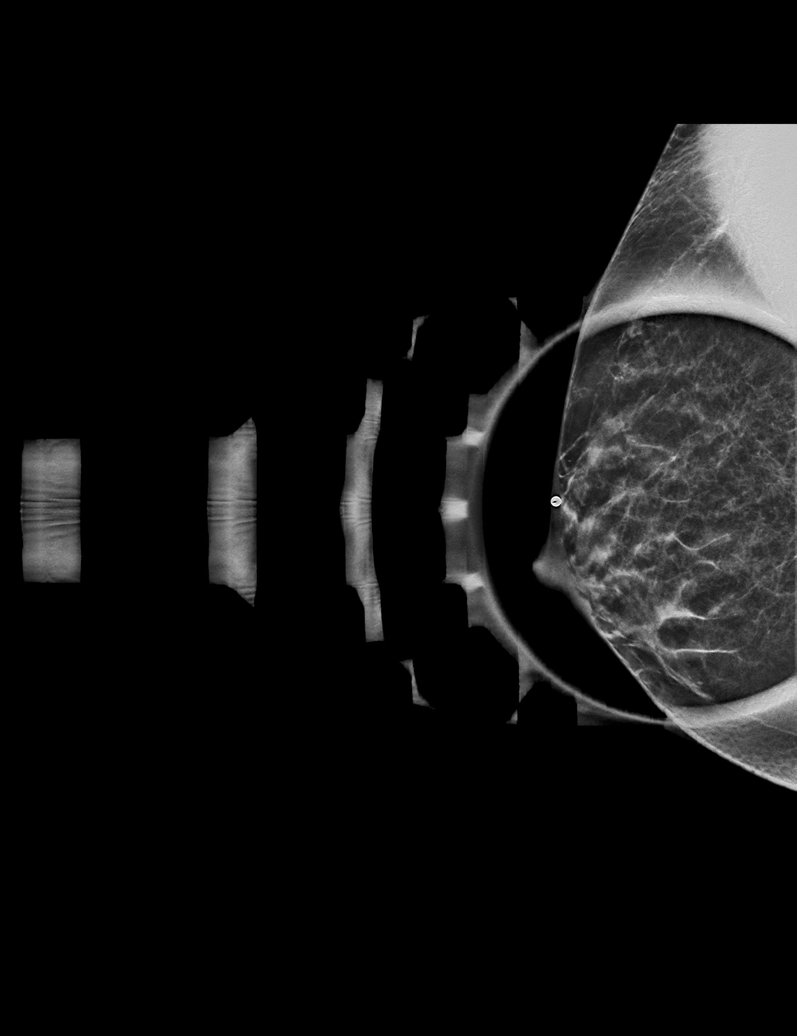

[R MLO synth-2D]
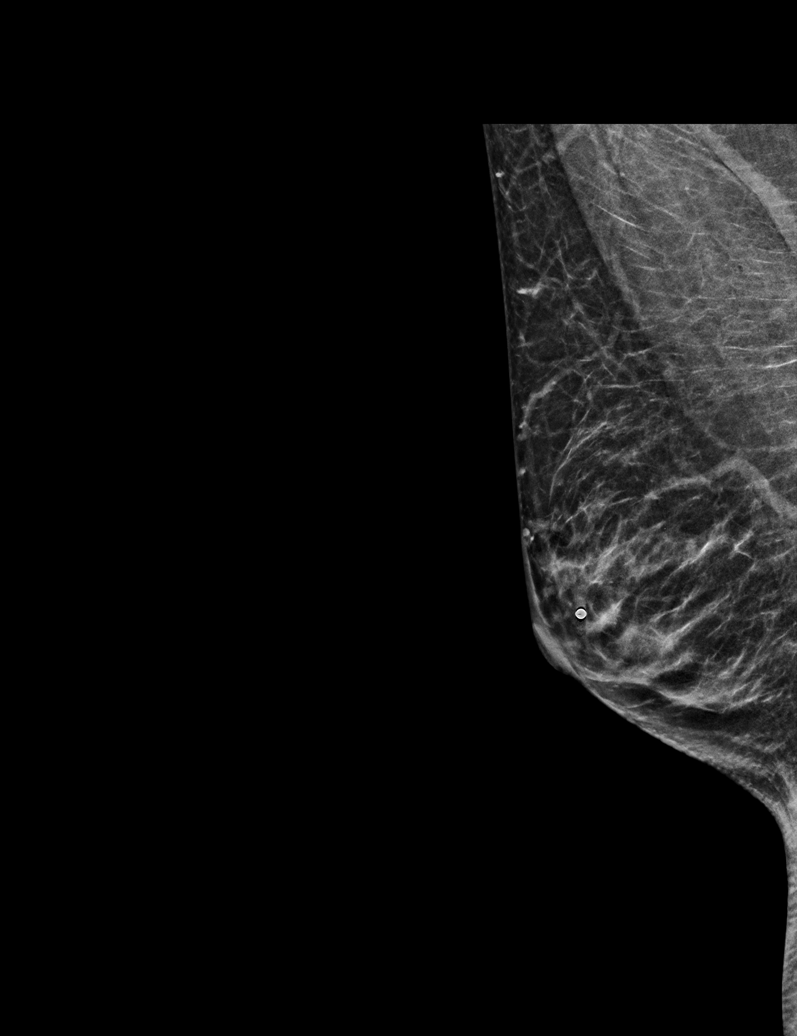

[R CC synth-2D]
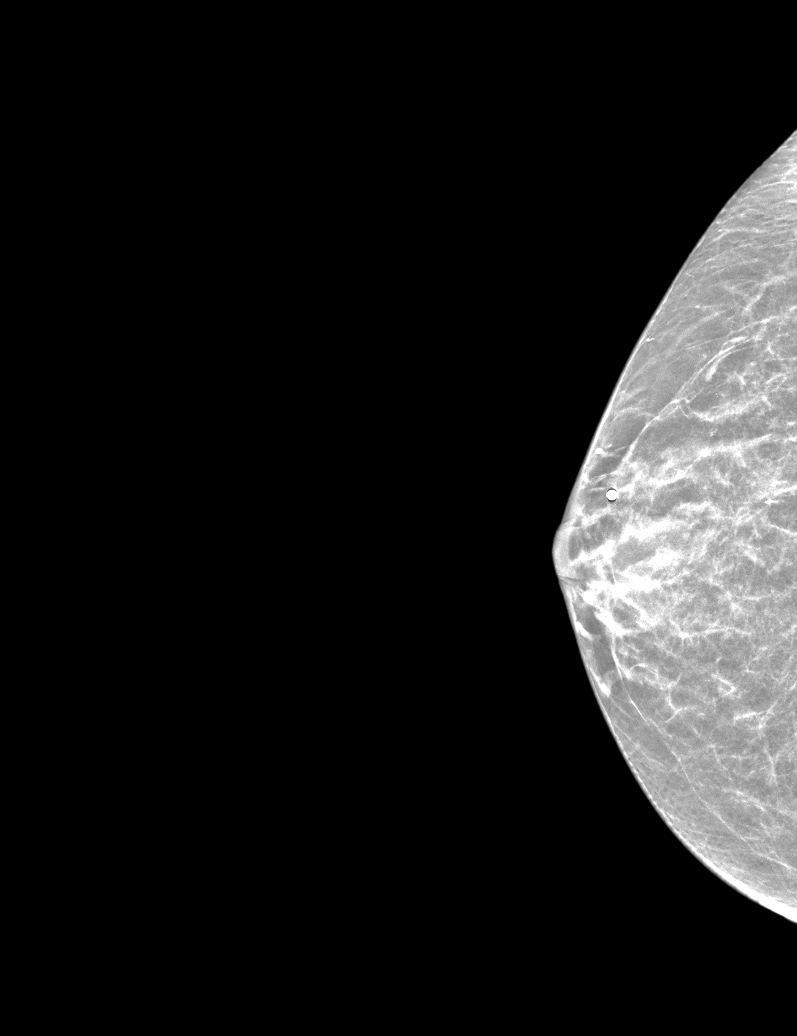

[R CC tomo · tomo slice 21/42.0]
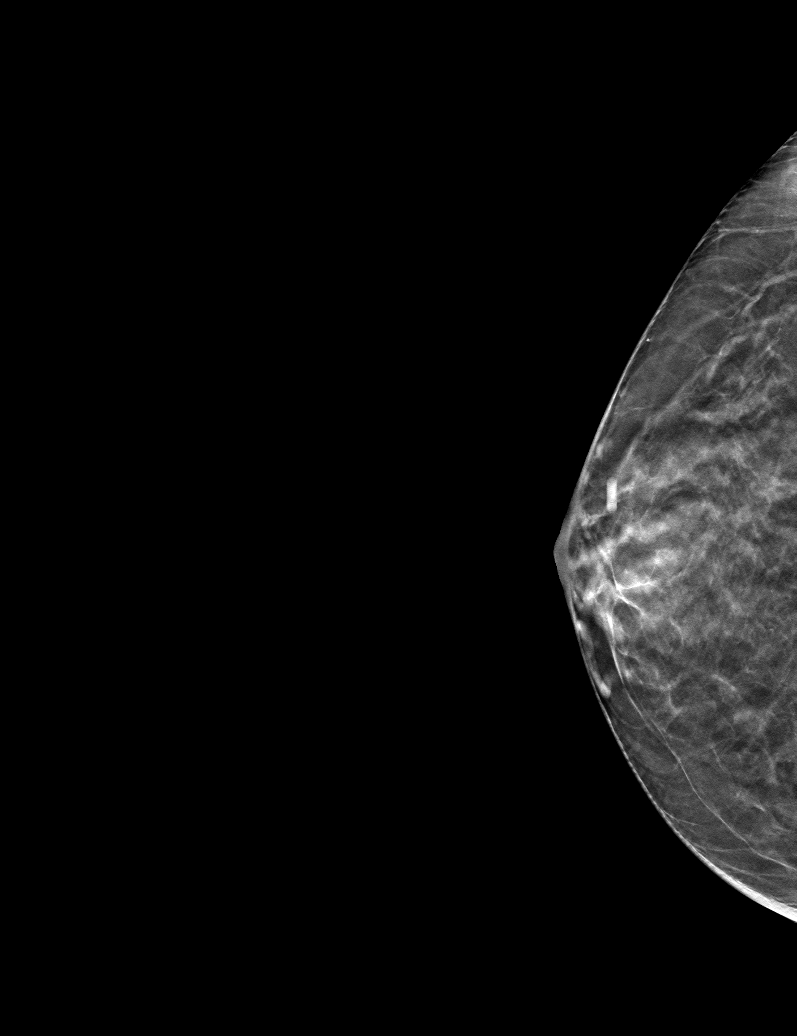

[6 of 30 positions shown; findings below may reference images not displayed]

ACR Breast Density Category b: There are scattered areas of
fibroglandular density.
FINDINGS: 2D/3D full field views of both breasts and a spot compression view
of the RIGHT breast demonstrate no suspicious mass, distortion or
worrisome calcifications.

On physical exam, mild thickening in the LOWER RETROAREOLAR RIGHT
breast identified without discrete palpable mass.

Targeted ultrasound is performed, showing no sonographic
abnormalities in the entire RETROAREOLAR RIGHT breast.
IMPRESSION: 1. No mammographic, suspicious palpable or sonographic abnormality
in the RIGHT breast, in the area of patient concern.
2. No mammographic evidence of breast malignancy.

RECOMMENDATION:
Bilateral screening mammogram at age 40.

Consider clinical follow-up as indicated. Any further workup should
be based on clinical grounds.

I have discussed the findings and recommendations with the patient.
If applicable, a reminder letter will be sent to the patient
regarding the next appointment.

BI-RADS CATEGORY  1: Negative.

## 2022-08-19 ENCOUNTER — Ambulatory Visit (INDEPENDENT_AMBULATORY_CARE_PROVIDER_SITE_OTHER): Payer: 59 | Admitting: Obstetrics & Gynecology

## 2022-08-19 ENCOUNTER — Encounter: Payer: Self-pay | Admitting: Obstetrics & Gynecology

## 2022-08-19 VITALS — BP 136/83 | HR 89 | Ht 66.0 in | Wt 159.0 lb

## 2022-08-19 DIAGNOSIS — Z3044 Encounter for surveillance of vaginal ring hormonal contraceptive device: Secondary | ICD-10-CM

## 2022-08-19 DIAGNOSIS — Z01419 Encounter for gynecological examination (general) (routine) without abnormal findings: Secondary | ICD-10-CM

## 2022-08-19 MED ORDER — ETONOGESTREL-ETHINYL ESTRADIOL 0.12-0.015 MG/24HR VA RING: VAGINAL_RING | VAGINAL | 4 refills | Status: DC

## 2022-08-19 NOTE — Progress Notes (Signed)
   WELL-WOMAN EXAMINATION Patient name: Wanda Allen MRN [id number]  Date of birth: [birth year] Chief Complaint:   Annual Exam  History of Present Illness:   Wanda Allen is a 36 y.o. G27P1001 female being seen today for a routine well-woman exam.   Today she notes no acute complaints or concerns.  Some irregular bleeding due to inconsistent removal of ring, but overall reports no issue with menses or contraception.  The current method of family planning is NuvaRing vaginal inserts.   Last pap 07/2021 negative.  Last mammogram: n/a. Last colonoscopy: n/a     08/09/2021   10:33 AM  Depression screen PHQ 2/9  Decreased Interest 2  Down, Depressed, Hopeless 1  PHQ - 2 Score 3  Altered sleeping 3  Tired, decreased energy 3  Change in appetite 2  Feeling bad or failure about yourself  0  Trouble concentrating 2  Moving slowly or fidgety/restless 0  Suicidal thoughts 0  PHQ-9 Score 13      Review of Systems:   Pertinent items are noted in HPI Denies any headaches, blurred vision, fatigue, shortness of breath, chest pain, abdominal pain, bowel movements, urination, or intercourse unless otherwise stated above.  Pertinent History Reviewed:  Reviewed past medical,surgical, social and family history.  Reviewed problem list, medications and allergies. Physical Assessment:   Vitals:   08/19/22 0840  BP: 136/83  Pulse: 89  Weight: 159 lb (72.1 kg)  Height: '5\' 6"'$  (1.676 m)  Body mass index is 25.66 kg/m.        Physical Examination:   General appearance - well appearing, and in no distress  Mental status - alert, oriented to person, place, and time  Psych:  She has a normal mood and affect  Skin - warm and dry, normal color, no suspicious lesions noted  Chest - effort normal, all lung fields clear to auscultation bilaterally  Heart - normal rate and regular rhythm  Neck:  midline trachea, no thyromegaly or nodules  Breasts - breasts appear normal, no suspicious  masses, no skin or nipple changes or  axillary nodes  Abdomen - soft, nontender, nondistended, no masses or organomegaly  Pelvic - VULVA: normal appearing vulva with no masses, tenderness or lesions  VAGINA: normal appearing vagina with normal color and discharge, no lesions  CERVIX: normal appearing cervix without discharge or lesions, no CMT  UTERUS: uterus is felt to be normal size, shape, consistency and nontender   ADNEXA: No adnexal masses or tenderness noted.  Extremities:  No swelling or varicosities noted  Chaperone:  pt declined      Assessment & Plan:  1) Well-Woman Exam Pap up to date, reviewed screening guidelines  2) Family planning/Contraception -doing well with nuvaring and plan to continue -may consider another child in the future discussed concerns with advancing age  Meds:  Meds ordered this encounter  Medications   etonogestrel-ethinyl estradiol (NUVARING) 0.12-0.015 MG/24HR vaginal ring    Sig: Insert vaginally and leave in place for 3 consecutive weeks, then remove for 1 week.    Dispense:  3 each    Refill:  4    Follow-up: Return in about 1 year (around 08/20/2023) for Annual.   Janyth Pupa, DO Attending [place], [place] for [facility], [place]

## 2023-08-09 ENCOUNTER — Telehealth: Payer: Self-pay | Admitting: Obstetrics & Gynecology

## 2023-08-09 ENCOUNTER — Other Ambulatory Visit: Payer: Self-pay | Admitting: Obstetrics & Gynecology

## 2023-08-09 DIAGNOSIS — Z3044 Encounter for surveillance of vaginal ring hormonal contraceptive device: Secondary | ICD-10-CM

## 2023-08-09 MED ORDER — ETONOGESTREL-ETHINYL ESTRADIOL 0.12-0.015 MG/24HR VA RING: VAGINAL_RING | VAGINAL | 4 refills | Status: DC

## 2023-08-09 NOTE — Telephone Encounter (Signed)
Refill sent.

## 2023-08-09 NOTE — Progress Notes (Signed)
Refill of nuva ring  Myna Hidalgo, DO Attending Obstetrician & Gynecologist, [facility] for [employer], [facility]

## 2023-08-09 NOTE — Telephone Encounter (Signed)
Pt states she needs the generic Nuvaring sent to her pharmacy.

## 2023-08-25 ENCOUNTER — Ambulatory Visit (INDEPENDENT_AMBULATORY_CARE_PROVIDER_SITE_OTHER): Payer: 59 | Admitting: Obstetrics & Gynecology

## 2023-08-25 ENCOUNTER — Encounter: Payer: Self-pay | Admitting: Obstetrics & Gynecology

## 2023-08-25 VITALS — BP 136/88 | HR 84 | Ht 66.0 in | Wt 144.6 lb

## 2023-08-25 DIAGNOSIS — Z01419 Encounter for gynecological examination (general) (routine) without abnormal findings: Secondary | ICD-10-CM | POA: Diagnosis not present

## 2023-08-25 DIAGNOSIS — Z3044 Encounter for surveillance of vaginal ring hormonal contraceptive device: Secondary | ICD-10-CM | POA: Diagnosis not present

## 2023-08-25 MED ORDER — ETONOGESTREL-ETHINYL ESTRADIOL 0.12-0.015 MG/24HR VA RING: VAGINAL_RING | VAGINAL | 4 refills | Status: DC

## 2023-08-25 NOTE — Progress Notes (Signed)
WELL-WOMAN EXAMINATION Patient name: Wanda Allen MRN [id number]  Date of birth: 09-16-1986 Chief Complaint:   Gynecologic Exam  History of Present Illness:   Wanda Allen is a 37 y.o. G21P1001 female being seen today for a routine well-woman exam.    -h.o PCOS: Typically does great with Nuvaring; however, insurance no longer covering name brand.  Switched to generic and noting breakthrough bleeding and some mood changes.  She had previously been on generic and hopeful that her body just needs some adjustment   Patient's last menstrual period was 08/10/2023.  The current method of family planning is NuvaRing vaginal inserts.    Last pap 2022.  Last mammogram: NA. Last colonoscopy: NA     08/25/2023    9:27 AM 08/09/2021   10:33 AM  Depression screen PHQ 2/9  Decreased Interest 0 2  Down, Depressed, Hopeless 1 1  PHQ - 2 Score 1 3  Altered sleeping 1 3  Tired, decreased energy 2 3  Change in appetite 0 2  Feeling bad or failure about yourself  0 0  Trouble concentrating 1 2  Moving slowly or fidgety/restless 0 0  Suicidal thoughts 0 0  PHQ-9 Score 5 13      Review of Systems:   Pertinent items are noted in HPI Denies any headaches, blurred vision, fatigue, shortness of breath, chest pain, abdominal pain, bowel movements, urination, or intercourse unless otherwise stated above.  Pertinent History Reviewed:  Reviewed past medical,surgical, social and family history.  Reviewed problem list, medications and allergies. Physical Assessment:   Vitals:   08/25/23 0929  BP: 136/88  Pulse: 84  Weight: 144 lb 9.6 oz (65.6 kg)  Height: 5\' 6"  (1.676 m)  Body mass index is 23.34 kg/m.        Physical Examination:   General appearance - well appearing, and in no distress  Mental status - alert, oriented to person, place, and time  Psych:  She has a normal mood and affect  Skin - warm and dry, normal color, no suspicious lesions noted  Chest - effort normal,  all lung fields clear to auscultation bilaterally  Heart - normal rate and regular rhythm  Neck:  midline trachea, no thyromegaly or nodules  Breasts - breasts appear normal, no suspicious masses, no skin or nipple changes or  axillary nodes  Abdomen - soft, nontender, nondistended, no masses or organomegaly  Pelvic - VULVA: normal appearing vulva with no masses, tenderness or lesions  VAGINA: normal appearing vagina with normal color and discharge, no lesions  CERVIX: normal appearing cervix without discharge or lesions, no CMT  UTERUS: uterus is felt to be normal size, shape, consistency and nontender   ADNEXA: No adnexal masses or tenderness noted.  Extremities:  No swelling or varicosities noted  Chaperone:  pt declined      Assessment & Plan:  1) Well-Woman Exam -pap up todate, reviewed screening guidelines  2) Contraceptive management -Rx sent in for nuvaring -if continues to note BRB will contact insurance for name brand only    Meds:  Meds ordered this encounter  Medications   etonogestrel-ethinyl estradiol (NUVARING) 0.12-0.015 MG/24HR vaginal ring    Sig: Insert vaginally and leave in place for 3 consecutive weeks, then remove for 1 week.    Dispense:  3 each    Refill:  4    If covered- please give name brand only    Follow-up: Return in about 1 year (around 08/24/2024) for Annual.  Myna Hidalgo, DO Attending Obstetrician & Gynecologist, [facility] for [employer], [facility]

## 2024-02-21 ENCOUNTER — Encounter: Payer: Self-pay | Admitting: Obstetrics & Gynecology

## 2024-02-22 ENCOUNTER — Other Ambulatory Visit: Payer: Self-pay | Admitting: Adult Health

## 2024-02-22 DIAGNOSIS — Z3044 Encounter for surveillance of vaginal ring hormonal contraceptive device: Secondary | ICD-10-CM

## 2024-02-22 MED ORDER — ETONOGESTREL-ETHINYL ESTRADIOL 0.12-0.015 MG/24HR VA RING: VAGINAL_RING | VAGINAL | 4 refills | Status: DC

## 2024-02-22 NOTE — Progress Notes (Signed)
 Rx sent in for nuva ring name brand

## 2024-05-09 ENCOUNTER — Other Ambulatory Visit: Payer: Self-pay | Admitting: *Deleted

## 2024-05-09 ENCOUNTER — Encounter: Payer: Self-pay | Admitting: Obstetrics & Gynecology

## 2024-05-09 DIAGNOSIS — Z3044 Encounter for surveillance of vaginal ring hormonal contraceptive device: Secondary | ICD-10-CM

## 2024-05-09 MED ORDER — ETONOGESTREL-ETHINYL ESTRADIOL 0.12-0.015 MG/24HR VA RING: VAGINAL_RING | VAGINAL | 4 refills | Status: DC

## 2024-09-02 ENCOUNTER — Other Ambulatory Visit (HOSPITAL_COMMUNITY)
Admission: RE | Admit: 2024-09-02 | Discharge: 2024-09-02 | Disposition: A | Source: Ambulatory Visit | Attending: Obstetrics & Gynecology | Admitting: Obstetrics & Gynecology

## 2024-09-02 ENCOUNTER — Encounter: Payer: Self-pay | Admitting: Obstetrics & Gynecology

## 2024-09-02 ENCOUNTER — Ambulatory Visit: Admitting: Obstetrics & Gynecology

## 2024-09-02 VITALS — BP 138/90 | HR 77 | Ht 66.0 in | Wt 133.0 lb

## 2024-09-02 DIAGNOSIS — Z3044 Encounter for surveillance of vaginal ring hormonal contraceptive device: Secondary | ICD-10-CM

## 2024-09-02 DIAGNOSIS — Z01419 Encounter for gynecological examination (general) (routine) without abnormal findings: Secondary | ICD-10-CM | POA: Diagnosis not present

## 2024-09-02 DIAGNOSIS — Z124 Encounter for screening for malignant neoplasm of cervix: Secondary | ICD-10-CM | POA: Diagnosis present

## 2024-09-02 MED ORDER — ETONOGESTREL-ETHINYL ESTRADIOL 0.12-0.015 MG/24HR VA RING: VAGINAL_RING | VAGINAL | 4 refills | Status: AC

## 2024-09-02 NOTE — Progress Notes (Signed)
 WELL-WOMAN EXAMINATION Patient name: Wanda Allen MRN [id number]  Date of birth: Mar 21, 1986 Chief Complaint:   Annual Exam  History of Present Illness:   Wanda Allen is a 38 y.o. G57P1001  female being seen today for a routine well-woman exam.   Menses are regular each month.  Denies intermenstrual bleeding.  Denies pelvic or abdominal pain.  Reports no acute GYN concerns  Sexually active with same partner though not sure last time STI screening had been completed  She has noted some right breast tenderness, but thinks it may be related to her menses.  Denies feeling lump or abnormality.  Prior imaging completed in 2023, no abnormalities noted at that time  Patient's last menstrual period was 09/02/2024 (exact date).  The current method of family planning is NuvaRing vaginal inserts.    Last pap 2022.  Last mammogram: NA. Last colonoscopy: NA     09/02/2024   10:10 AM 08/25/2023    9:27 AM 08/09/2021   10:33 AM  Depression screen PHQ 2/9  Decreased Interest 0 0 2  Down, Depressed, Hopeless 1 1 1   PHQ - 2 Score 1 1 3   Altered sleeping 2 1 3   Tired, decreased energy 2 2 3   Change in appetite 1 0 2  Feeling bad or failure about yourself  0 0 0  Trouble concentrating 1 1 2   Moving slowly or fidgety/restless 0 0 0  Suicidal thoughts 0 0 0  PHQ-9 Score 7 5 13       Review of Systems:   Pertinent items are noted in HPI Denies any headaches, blurred vision, fatigue, shortness of breath, chest pain, abdominal pain, bowel movements, urination, or intercourse unless otherwise stated above.  Pertinent History Reviewed:  Reviewed past medical,surgical, social and family history.  Reviewed problem list, medications and allergies. Physical Assessment:   Vitals:   09/02/24 1002 09/02/24 1006  BP: (!) 161/103 (!) 138/90  Pulse: 77   Weight: 133 lb (60.3 kg)   Height: 5' 6 (1.676 m)   Body mass index is 21.47 kg/m.        Physical Examination:   General  appearance - well appearing, and in no distress  Mental status - alert, oriented to person, place, and time  Psych:  She has a normal mood and affect  Skin - warm and dry, normal color, no suspicious lesions noted  Chest - effort normal, all lung fields clear to auscultation bilaterally  Heart - normal rate and regular rhythm  Neck:  midline trachea, no thyromegaly or nodules  Breasts - breasts appear normal, no suspicious masses, no skin or nipple changes or  axillary nodes  Abdomen - soft, nontender, nondistended, no masses or organomegaly  Pelvic - VULVA: normal appearing vulva with no masses, tenderness or lesions  VAGINA: normal appearing vagina with normal color and discharge, no lesions  CERVIX: normal appearing cervix without discharge or lesions, no CMT. First day of menses- light spotting noted  Thin prep pap is done with HR HPV cotesting  UTERUS: uterus is felt to be normal size, shape, consistency and nontender   ADNEXA: No adnexal masses or tenderness noted.  Extremities:  No swelling or varicosities noted  Chaperone: Aleck Blase     Assessment & Plan:  1) Well-Woman Exam -Pap collected, reviewed ASCCP guidelines -STI screening to be completed  2) Contraception -continue with Nuvaring -home BPs within normal range  3) breast tenderness -pt to monitor, if symptoms persist s/p menses, []  call office  plan for imaaging  No orders of the defined types were placed in this encounter.   Meds:  Meds ordered this encounter  Medications   etonogestrel -ethinyl estradiol  (NUVARING) 0.12-0.015 MG/24HR vaginal ring    Sig: Insert vaginally and leave in place for 3 consecutive weeks, then remove for 1 week.    Dispense:  3 each    Refill:  4    DAW,name brand only had side effects with generic    Follow-up: Return in about 1 year (around 09/02/2025) for Annual.   Wanda Royce, DO Attending Obstetrician & Gynecologist, [facility] for [facility],  [facility]

## 2024-09-10 ENCOUNTER — Ambulatory Visit: Payer: Self-pay | Admitting: Obstetrics & Gynecology

## 2024-09-10 LAB — CYTOLOGY - PAP
Adequacy: ABNORMAL
Chlamydia: NEGATIVE
Comment: NEGATIVE
Comment: NEGATIVE
Comment: NORMAL
Neisseria Gonorrhea: NEGATIVE

## 2024-09-27 ENCOUNTER — Ambulatory Visit: Admitting: Obstetrics & Gynecology

## 2024-09-27 ENCOUNTER — Encounter: Payer: Self-pay | Admitting: Obstetrics & Gynecology

## 2024-09-27 ENCOUNTER — Other Ambulatory Visit (HOSPITAL_COMMUNITY)
Admission: RE | Admit: 2024-09-27 | Discharge: 2024-09-27 | Disposition: A | Discharge status: Home / Self Care | Source: Ambulatory Visit | Attending: Obstetrics & Gynecology | Admitting: Obstetrics & Gynecology

## 2024-09-27 VITALS — BP 144/86 | HR 83 | Ht 66.0 in | Wt 137.0 lb

## 2024-09-27 DIAGNOSIS — Z124 Encounter for screening for malignant neoplasm of cervix: Secondary | ICD-10-CM | POA: Insufficient documentation

## 2024-09-27 NOTE — Progress Notes (Signed)
   GYN VISIT Patient name: Wanda Allen MRN [id number]  Date of birth: December 08, 1986 Chief Complaint:   Follow-up (Repeat pap)  History of Present Illness:   Wanda Allen is a 38 y.o. G89P1001 female being seen today for follow up Pap.   Recent pap was insufficient.    No acute changes or concern since prior visit..     Patient's last menstrual period was 09/02/2024 (exact date).    Review of Systems:   Pertinent items are noted in HPI Denies fever/chills, dizziness, headaches, visual disturbances, fatigue, shortness of breath, chest pain, abdominal pain, vomiting, no problems with periods, bowel movements, urination, or intercourse unless otherwise stated above.  Pertinent History Reviewed:   Past Surgical History:  Procedure Laterality Date   CESAREAN SECTION N/A 08/08/2018   Procedure: CESAREAN SECTION;  Surgeon: Modesta Sammons, DO;  Location: [facility];  Service: Obstetrics;  Laterality: N/A;   mass removal     mole     MUSCLE BIOPSY      Past Medical History:  Diagnosis Date   Abdominal pain    ADD (attention deficit disorder) 03/01/2011   ADD (attention deficit disorder)    Allergy    Asthma    Attention deficit disorder (ADD)    Atypical moles    Bronchitis    Cancer (HCC)    melanoma R knee   Contact lens/glasses fitting 03/01/2011   COVID-19    Fatigue 03/01/2011   Loss of sleep/fatigue   Fatigue    Generalized headaches    May be due to allergies or medication. Patient is not sure.   Headache disorder 03/01/2011   Insomnia, unspecified 03/01/2011   Joint pain, elbow    Joint pain, elbow    JP drain bleeding    PCOS (polycystic ovarian syndrome)    Polycythemia vera (HCC)    PONV (postoperative nausea and vomiting)    Sinus drainage 03/01/2011   Sinus problem    Skin moles 03/01/2011   Reviewed problem list, medications and allergies. Physical Assessment:   Vitals:   09/27/24 0853 09/27/24 0909  BP: (!) 154/84 (!) 144/86  Pulse:  80 83  Weight: 137 lb (62.1 kg)   Height: 5' 6 (1.676 m)   Body mass index is 22.11 kg/m.       Physical Examination:   General appearance: alert, well appearing, and in no distress  Psych: mood appropriate, normal affect  Skin: warm & dry   Respiratory: normal respiratory effort, no distress  Pelvic: VULVA: normal appearing vulva with no masses, tenderness or lesions, VAGINA: normal appearing vagina with normal color and discharge, no lesions, CERVIX: normal appearing cervix without discharge or lesions   Chaperone: Alan Fischer    Assessment & Plan:  1) Preventive screening -pap collected today  -BP at home normal, pt notes she is typically rushing/in a hurry to get to our office from [place]   No orders of the defined types were placed in this encounter.   Return in about 1 year (around 09/27/2025) for Annual.   Sandy Haye, DO Attending Obstetrician & Gynecologist, [facility] for [facility], [facility]

## 2024-09-30 LAB — CYTOLOGY - PAP
Comment: NEGATIVE
Diagnosis: NEGATIVE
High risk HPV: NEGATIVE

## 2024-10-01 ENCOUNTER — Ambulatory Visit: Payer: Self-pay | Admitting: Obstetrics & Gynecology
